# Patient Record
Sex: Female | Born: 1960 | Hispanic: No | Marital: Married | Smoking: Never smoker
Health system: Southern US, Community
[De-identification: ages and names within clinical notes are randomized; demographics above are authoritative.]

## PROBLEM LIST (undated history)

## (undated) DIAGNOSIS — K219 Gastro-esophageal reflux disease without esophagitis: Secondary | ICD-10-CM

## (undated) DIAGNOSIS — M1990 Unspecified osteoarthritis, unspecified site: Secondary | ICD-10-CM

## (undated) DIAGNOSIS — E7800 Pure hypercholesterolemia, unspecified: Secondary | ICD-10-CM

## (undated) DIAGNOSIS — G709 Myoneural disorder, unspecified: Secondary | ICD-10-CM

## (undated) HISTORY — DX: Gastro-esophageal reflux disease without esophagitis: K21.9

## (undated) HISTORY — PX: CHOLECYSTECTOMY: SHX55

## (undated) HISTORY — DX: Myoneural disorder, unspecified: G70.9

## (undated) HISTORY — DX: Unspecified osteoarthritis, unspecified site: M19.90

---

## 2020-08-29 ENCOUNTER — Ambulatory Visit (INDEPENDENT_AMBULATORY_CARE_PROVIDER_SITE_OTHER): Payer: Medicaid Other | Admitting: Family Medicine

## 2020-08-29 ENCOUNTER — Other Ambulatory Visit: Payer: Self-pay

## 2020-08-29 ENCOUNTER — Other Ambulatory Visit (HOSPITAL_COMMUNITY)
Admission: RE | Admit: 2020-08-29 | Discharge: 2020-08-29 | Disposition: A | Discharge status: Home / Self Care | Payer: Medicaid Other | Source: Ambulatory Visit | Attending: Family Medicine | Admitting: Family Medicine

## 2020-08-29 VITALS — BP 132/86 | HR 82 | Ht 62.01 in | Wt 172.0 lb

## 2020-08-29 DIAGNOSIS — N6320 Unspecified lump in the left breast, unspecified quadrant: Secondary | ICD-10-CM

## 2020-08-29 DIAGNOSIS — Z0289 Encounter for other administrative examinations: Secondary | ICD-10-CM

## 2020-08-29 DIAGNOSIS — Z0000 Encounter for general adult medical examination without abnormal findings: Secondary | ICD-10-CM | POA: Insufficient documentation

## 2020-08-29 LAB — POCT UA - MICROSCOPIC ONLY

## 2020-08-29 LAB — POCT URINALYSIS DIP (MANUAL ENTRY)
Bilirubin, UA: NEGATIVE
Glucose, UA: NEGATIVE mg/dL
Ketones, POC UA: NEGATIVE mg/dL
Nitrite, UA: NEGATIVE
Protein Ur, POC: NEGATIVE mg/dL
Spec Grav, UA: 1.025 (ref 1.010–1.025)
Urobilinogen, UA: 0.2 E.U./dL
pH, UA: 5 (ref 5.0–8.0)

## 2020-08-29 LAB — POCT URINE PREGNANCY: Preg Test, Ur: NEGATIVE

## 2020-08-29 MED ORDER — IVERMECTIN 3 MG PO TABS: 200.0000 ug/kg | ORAL_TABLET | Freq: Every day | ORAL | 0 refills | Status: AC

## 2020-08-29 MED ORDER — ALBENDAZOLE 200 MG PO TABS: 400.0000 mg | ORAL_TABLET | Freq: Once | ORAL | 0 refills | Status: AC

## 2020-08-29 NOTE — Progress Notes (Signed)
Patient Name: Nichole Baker Date of Birth: Dec 26, 1960 Date of Visit: 08/29/20 PCP: No primary care provider on file.  Chief Complaint: refugee intake examination and rash/lump left breast and back.  The patient's preferred language is Dari. A phone interpreter was used for the entire visit.  Interpreter Name or ID: Jamison Oka #[id number], Female phone interpreter for 2nd half of visit.   Subjective: Nichole Baker is a pleasant 60 y.o. presenting today for an initial refugee and immigrant clinic visit.   According to the patient, she first noticed a spot on her left breast 4 months ago. She then also noticed a lump in her breast and began experiencing swelling and and a sense of heaviness on her left side extending into her left upper back. She states that the spot on the skin of her chest has not changed in size. The lump and the swelling she feels in her breast and back has not changed in size since she first noticed it, but it has become progressively more painful. She describes the pain as aching, burning, and itching. No nipple discharge. There is also occasionally the sensation of something crawling under the skin where the breast lump is, but she denies noticing any red lines over her chest. She reports chest pain that she feels is related to the lump that is primarily on the left side. No shortness of breath with exertion. She is unsure if the back pain she is feeling is related to the rash/lump in breast because she is having both upper and lower back pain. Patient is unsure if she had chicken pox as a child. The patient also complains of left sided shoulder pain that shoots down to her hand. Patient denies any lump or rash or pain on right side of chest.  She also reports some mild knee pain.  Patient also has a history of hemorrhoids since she got married. It is not currently bothering her. No blood in stool or pain with bowel movements. She previously used a cream for relief.   ROS: Negative  unless noted in HPI above.  GYN history deferred.  PMH: Hemorrhoids   PSH: Cholecystectomy (8 yrs ago)  FH: Sister in [place] with Diabetes  Allergies: NKDA   Current Medications: None  Social History: Home: Lives with husband and 4 children currently.  Diet: Able to find foods that are acceptable Safety: Feels safe at home.  Tobacco Use: No Alcohol Use: No In the past two weeks, have you run out of food before you had money to purchase more? No  In the past two weeks, have you had difficulty with obtaining food for your family? 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 Normocephalic. Sclera anicteric. Appears well hydrated. Neck: Supple, Normal range of motion. Cardiac: Mildly tachycardic, regular rhythm. Normal S1/S2. No murmurs, rubs, or gallops appreciated. Lungs: Clear bilaterally to ascultation. Normal work of breathing. Abdomen: Normoactive bowel sounds. No tenderness to deep or light palpation. No  rebound or guarding. No CVA tenderness. Extremities: Warm, well perfused without edema.  Skin: small 1cm red ring surrounding black pinpoint spot on RUQ of left breast. No rash noted over left side and back. Scattered small cherry hemangiomas noted over back and abdominal skin. Breast: 7cm x 6cm firm, mobile mass noted in LLQ of left breast. Enlarged blood vessel noted over areola. No significant retraction  or dimpling noted over areola or skin of breast. No discharge noted.   Axilla: No lymphadenopathy in left axilla. Back: No rash or swelling noted over left back. Psych: Pleasant and appropriate  MSK: No gross deformity noted. Neuro: Appropriate gait.    Assessment and Plan: 1. Refugee Initial Exam -Presumptive Parasite Treatment with Albendazole and Ivermectin Prescribed -Urine sample collected (UA, UPT) -Blood work completed (HCV, TSH, Lipid Panel, HepB, VZV titer, CMP, CBC, HIV, RPR) -Follow up with patient on March 25th, 2022.  2. Left Breast Mass Large (7cmx6cm) left sided breast mass noted on physical exam. Patient denies it changing significantly in size, but reports it has become progressively more painful. Given that the mass is mobile, it is possible that it could be a fibroadenoma, though it does not change in size. The mass may also represent a large breast cyst or lipoma.  -Patient scheduled for Mammogram and Breast ultrasound on 10/11/2020 -Will consider further management after results of ultrasound. -Patient instructed to follow up sooner if she develops new breast discharge or significant changes to the mass. - Explained blackhead noted on breast- no treatment needed as it is resolving on its own  3. Hemorrhoids -Given that hemorrhoids are not currently bothering the patient, will follow up if they become symptomatic or if there is new onset of blood in stool.    I have personally updated the history tabs within Epic and included the refugee information in social documentation.   Designated Partner Release signed with agency  Release of information signed for Health Department   Return to care in 1 month in [facility] for visit with PCP (09/29/2020 with Dr. Thompson Grayer).   Vaccines: updated in [employer] of Supervision of Student:  I confirm that I have verified the information documented in the medical student's note and that I have also personally performed the history,  physical exam and all medical decision making activities.  I have verified that all services and findings are accurately documented in this student's note; and I agree with management and plan as outlined in the documentation. I have also made any necessary editorial changes.   Lenoria Chime, MD

## 2020-08-29 NOTE — Patient Instructions (Addendum)
It was wonderful to see you today.  Please bring ALL of your medications with you to every visit.   Today we talked about:  - F/u appt scheduled with Dr Thompson Grayer to f/u breast mass - Presumptive parasite treatment: albendazole 400 mg once and ivermectin 5 tabs daily x 2 days - We will call you with your lab work results - Mammogram and ultrasound scheduled    Thank you for choosing [facility].   Please call [phone] with any questions about today's appointment.  Please be sure to schedule follow up at the front  desk before you leave today.   Yehuda Savannah, MD  Family Medicine

## 2020-08-30 DIAGNOSIS — N6320 Unspecified lump in the left breast, unspecified quadrant: Secondary | ICD-10-CM | POA: Insufficient documentation

## 2020-08-30 LAB — COMPREHENSIVE METABOLIC PANEL
ALT: 12 IU/L (ref 0–32)
AST: 10 IU/L (ref 0–40)
Albumin/Globulin Ratio: 1.6 (ref 1.2–2.2)
Albumin: 4.4 g/dL (ref 3.8–4.9)
Alkaline Phosphatase: 94 IU/L (ref 44–121)
BUN/Creatinine Ratio: 19 (ref 9–23)
BUN: 13 mg/dL (ref 6–24)
Bilirubin Total: 0.3 mg/dL (ref 0.0–1.2)
CO2: 17 mmol/L — ABNORMAL LOW (ref 20–29)
Calcium: 9.4 mg/dL (ref 8.7–10.2)
Chloride: 107 mmol/L — ABNORMAL HIGH (ref 96–106)
Creatinine, Ser: 0.69 mg/dL (ref 0.57–1.00)
GFR calc Af Amer: 111 mL/min/{1.73_m2} (ref >59)
GFR calc non Af Amer: 96 mL/min/{1.73_m2} (ref >59)
Globulin, Total: 2.8 g/dL (ref 1.5–4.5)
Glucose: 116 mg/dL — ABNORMAL HIGH (ref 65–99)
Potassium: 4.1 mmol/L (ref 3.5–5.2)
Sodium: 143 mmol/L (ref 134–144)
Total Protein: 7.2 g/dL (ref 6.0–8.5)

## 2020-08-30 LAB — HEPATITIS B SURFACE ANTIGEN: Hepatitis B Surface Ag: NEGATIVE

## 2020-08-30 LAB — CBC WITH DIFFERENTIAL/PLATELET
Basophils Absolute: 0.1 10*3/uL (ref 0.0–0.2)
Basos: 1 %
EOS (ABSOLUTE): 0.2 10*3/uL (ref 0.0–0.4)
Eos: 2 %
Hematocrit: 41.5 % (ref 34.0–46.6)
Hemoglobin: 13.9 g/dL (ref 11.1–15.9)
Immature Grans (Abs): 0 10*3/uL (ref 0.0–0.1)
Immature Granulocytes: 0 %
Lymphocytes Absolute: 2.1 10*3/uL (ref 0.7–3.1)
Lymphs: 27 %
MCH: 29 pg (ref 26.6–33.0)
MCHC: 33.5 g/dL (ref 31.5–35.7)
MCV: 87 fL (ref 79–97)
Monocytes Absolute: 0.4 10*3/uL (ref 0.1–0.9)
Monocytes: 6 %
Neutrophils Absolute: 4.9 10*3/uL (ref 1.4–7.0)
Neutrophils: 64 %
Platelets: 289 10*3/uL (ref 150–450)
RBC: 4.79 x10E6/uL (ref 3.77–5.28)
RDW: 13.1 % (ref 11.7–15.4)
WBC: 7.6 10*3/uL (ref 3.4–10.8)

## 2020-08-30 LAB — HEPATITIS B SURFACE ANTIBODY, QUANTITATIVE: Hepatitis B Surf Ab Quant: 199.2 m[IU]/mL (ref >9.9)

## 2020-08-30 LAB — LIPID PANEL
Chol/HDL Ratio: 6.7 ratio — ABNORMAL HIGH (ref 0.0–4.4)
Cholesterol, Total: 280 mg/dL — ABNORMAL HIGH (ref 100–199)
HDL: 42 mg/dL (ref >39)
LDL Chol Calc (NIH): 187 mg/dL — ABNORMAL HIGH (ref 0–99)
Triglycerides: 262 mg/dL — ABNORMAL HIGH (ref 0–149)
VLDL Cholesterol Cal: 51 mg/dL — ABNORMAL HIGH (ref 5–40)

## 2020-08-30 LAB — RPR: RPR Ser Ql: NONREACTIVE

## 2020-08-30 LAB — HEPATITIS B CORE ANTIBODY, TOTAL: Hep B Core Total Ab: NEGATIVE

## 2020-08-30 LAB — HCV INTERPRETATION

## 2020-08-30 LAB — VARICELLA ZOSTER ANTIBODY, IGG: Varicella zoster IgG: 2922 index (ref >165)

## 2020-08-30 LAB — HCV AB W REFLEX TO QUANT PCR: HCV Ab: 0.1 s/co ratio (ref 0.0–0.9)

## 2020-08-30 LAB — TSH: TSH: 1.28 u[IU]/mL (ref 0.450–4.500)

## 2020-08-30 LAB — HIV ANTIBODY (ROUTINE TESTING W REFLEX): HIV Screen 4th Generation wRfx: NONREACTIVE

## 2020-08-31 LAB — URINE CYTOLOGY ANCILLARY ONLY
Chlamydia: NEGATIVE
Comment: NEGATIVE
Comment: NORMAL
Neisseria Gonorrhea: NEGATIVE

## 2020-09-08 ENCOUNTER — Telehealth: Payer: Self-pay

## 2020-09-08 NOTE — Telephone Encounter (Signed)
Received fax from pharmacy, PA needed on Ivermectin 3mg . Clinical questions submitted via Cover My Meds. Waiting on response, could take up to 72 hours.  Cover My Meds info: Key: [id number]

## 2020-09-12 NOTE — Telephone Encounter (Signed)
I attempted to inform the pharmacy, however unsure what pharmacy the patient used. The original prescription was a paper print out. I called the patient and she stated she would have to reach out to her "helpers" to see which pharmacy and will call back.

## 2020-09-15 ENCOUNTER — Ambulatory Visit: Payer: Self-pay | Admitting: Family Medicine

## 2020-09-22 NOTE — Progress Notes (Signed)
    SUBJECTIVE:   CHIEF COMPLAINT / HPI:   Left breast mass- diagnostic mammogram scheduled October 11, 2020. Have called twice and no sooner appointments available. Patient notes occassional breast pain is about the same, does not feel like the mass is growing, describes a heaviness in her breast as well. No skin changes or breast discharge or bleeding that she has noticed.  Elevated LDL- she walks 1-1.5 hours multiple days per week, has been using less oil eating more vegetables. No sweets for the past month as they felt the kids were "getting addicted."  Took albendazole but was unable to pick up ivermectin. Needs new script, prior auth approved. Sponsor notes they tried to pick it up multiple times. Will resend.  PERTINENT  PMH / PSH:as above  OBJECTIVE:   BP 112/70   Pulse 83   Ht 5\' 2"  (1.575 m)   Wt 170 lb 12.8 oz (77.5 kg)   SpO2 97%   BMI 31.24 kg/m   General: A&O, NAD HEENT: No sign of trauma, EOM grossly intact Cardiac: RRR, no m/r/g Breast: mobile firm mass 6cm x 7cm in LLQ of left breast without overlying skin changes, dimpling, or nipple discharge. Enlarged blood vessel over left areola noted. Respiratory: CTAB, normal WOB, no w/c/r GI: Soft, NTTP, non-distended  Extremities: NTTP, no peripheral edema. Neuro: Normal gait, moves all four extremities appropriately. Psych: Appropriate mood and affect  Eusebio Friendly CMA present as chaperone for breast exam.  ASSESSMENT/PLAN:   Encounter for health examination of refugee - ivermectin resent to pharmacy, 5 tabs daily x 2 days - discussed lab results from initial visit today  Elevated LDL cholesterol level - LDL 187 on recent lipid panel, discussed with patient trial of lifestyle changes versus starting statin, she would like to try lifestyle changes, discussed diet and exercise in detail today  Left breast mass - given date/time of mammogram appointment, repeat exam unchanged today, answered all  questions/concerns - schedule f/u appt 4/8 in clinic to discuss mammogram results/next steps in case we are unable to reach by phone   Discussed need for pap smear, have scheduled f/u visit with me after mammogram results return to discuss further  Nichole Chime, MD [place]

## 2020-09-29 ENCOUNTER — Encounter: Payer: Self-pay | Admitting: Family Medicine

## 2020-09-29 ENCOUNTER — Other Ambulatory Visit: Payer: Self-pay

## 2020-09-29 ENCOUNTER — Ambulatory Visit (INDEPENDENT_AMBULATORY_CARE_PROVIDER_SITE_OTHER): Payer: Medicaid Other | Admitting: Family Medicine

## 2020-09-29 VITALS — BP 112/70 | HR 83 | Ht 62.0 in | Wt 170.8 lb

## 2020-09-29 DIAGNOSIS — N6320 Unspecified lump in the left breast, unspecified quadrant: Secondary | ICD-10-CM

## 2020-09-29 DIAGNOSIS — E7800 Pure hypercholesterolemia, unspecified: Secondary | ICD-10-CM | POA: Diagnosis not present

## 2020-09-29 DIAGNOSIS — Z0289 Encounter for other administrative examinations: Secondary | ICD-10-CM | POA: Diagnosis not present

## 2020-09-29 MED ORDER — IVERMECTIN 3 MG PO TABS: 200.0000 ug/kg | ORAL_TABLET | Freq: Every day | ORAL | 0 refills | Status: DC

## 2020-09-29 NOTE — Assessment & Plan Note (Addendum)
-   given date/time of mammogram appointment, have called [facility] twice and unable to do sooner, called [facility] and unable to do mammogram based on insurance, thus her current appt is the soonest she is able to be seen - repeat exam unchanged today, answered all questions/concerns - schedule f/u appt 4/8 in clinic to discuss mammogram results/next steps in case we are unable to reach by phone

## 2020-09-29 NOTE — Assessment & Plan Note (Signed)
-   ivermectin resent to pharmacy, 5 tabs daily x 2 days - discussed lab results from initial visit today

## 2020-09-29 NOTE — Patient Instructions (Addendum)
It was wonderful to see you today.  Please bring ALL of your medications with you to every visit.   Today we talked about:  - Mammogram scheduled for 4/6, and then follow up with [place] for 4/8 - Will need pap smear in future, have scheduled follow-up visit for that   Thank you for choosing [facility].   Please call [phone] with any questions about today's appointment.  Please be sure to schedule follow up at the front  desk before you leave today.   Yehuda Savannah, MD  Family Medicine

## 2020-09-29 NOTE — Assessment & Plan Note (Signed)
-   LDL 187 on recent lipid panel, discussed with patient trial of lifestyle changes versus starting statin, she would like to try lifestyle changes, discussed diet and exercise in detail today

## 2020-10-11 ENCOUNTER — Other Ambulatory Visit: Payer: Self-pay

## 2020-10-11 ENCOUNTER — Ambulatory Visit
Admission: RE | Admit: 2020-10-11 | Discharge: 2020-10-11 | Disposition: A | Discharge status: Home / Self Care | Payer: Medicaid Other | Source: Ambulatory Visit | Attending: Family Medicine | Admitting: Family Medicine

## 2020-10-11 ENCOUNTER — Other Ambulatory Visit: Payer: Self-pay | Admitting: Family Medicine

## 2020-10-11 DIAGNOSIS — N6320 Unspecified lump in the left breast, unspecified quadrant: Secondary | ICD-10-CM

## 2020-10-13 ENCOUNTER — Encounter: Payer: Self-pay | Admitting: Family Medicine

## 2020-10-13 ENCOUNTER — Other Ambulatory Visit: Payer: Self-pay

## 2020-10-13 ENCOUNTER — Ambulatory Visit (INDEPENDENT_AMBULATORY_CARE_PROVIDER_SITE_OTHER): Payer: Medicaid Other | Admitting: Family Medicine

## 2020-10-13 VITALS — BP 125/75 | HR 86 | Ht 60.32 in | Wt 170.0 lb

## 2020-10-13 DIAGNOSIS — Z1211 Encounter for screening for malignant neoplasm of colon: Secondary | ICD-10-CM

## 2020-10-13 DIAGNOSIS — R140 Abdominal distension (gaseous): Secondary | ICD-10-CM | POA: Insufficient documentation

## 2020-10-13 DIAGNOSIS — H7193 Unspecified cholesteatoma, bilateral: Secondary | ICD-10-CM

## 2020-10-13 DIAGNOSIS — R4589 Other symptoms and signs involving emotional state: Secondary | ICD-10-CM | POA: Insufficient documentation

## 2020-10-13 DIAGNOSIS — Z0000 Encounter for general adult medical examination without abnormal findings: Secondary | ICD-10-CM

## 2020-10-13 DIAGNOSIS — L700 Acne vulgaris: Secondary | ICD-10-CM | POA: Insufficient documentation

## 2020-10-13 DIAGNOSIS — F418 Other specified anxiety disorders: Secondary | ICD-10-CM

## 2020-10-13 DIAGNOSIS — H7190 Unspecified cholesteatoma, unspecified ear: Secondary | ICD-10-CM | POA: Insufficient documentation

## 2020-10-13 MED ORDER — CALCIUM CARBONATE ANTACID 500 MG PO CHEW: 1.0000 | CHEWABLE_TABLET | Freq: Every day | ORAL | 1 refills | Status: DC | PRN

## 2020-10-13 NOTE — Assessment & Plan Note (Signed)
Patient required a lot of reassurance in regards to her single breast bump which I feel is most likely a pimple.  She continuously asked if there is medication for this though I continue to reassure her.  I do believe that she has a component of anxiety regarding her health, which is understandable.  I tried to reassure her as much as possible.  I do believe that her palpitations are likely related to anxiety as they only seem to occur when going to doctors offices.  -Continue to offer reassurance -Consider investigating the cause of her underlying worries regarding her health

## 2020-10-13 NOTE — Assessment & Plan Note (Signed)
Patient is due for a Pap smear and screening colonoscopy.  I did make an appointment for her to be seen by me on 4/19, though I just noticed that she has an earlier appointment with her PCP on 4/14.  She may be able to get Pap smear at that time.  She can keep appointment with me afterwards to discuss results if PCP would like, otherwise could cancel her follow-up appointment with me. -GI referral sent for screening colonoscopy

## 2020-10-13 NOTE — Assessment & Plan Note (Signed)
Appreciated on bilateral ears.  Patient did have elevated lipid panel though ASCVD risk only 4.3% and statins not recommended at this time.  She does walk 1 hour a day for exercise.  Also notes that she has been improving her diet and cutting back on fried foods.  Hearing screening was not completed today.  Could consider a follow-up.  Referral to ENT if necessary, pending screening. -Consider hearing screen at follow-up -Consider ENT referral if necessary

## 2020-10-13 NOTE — Assessment & Plan Note (Signed)
There was a blackhead noted to the superior anterior medial left breast.  This was easily removed.  Recommended that patient apply warm compresses to the surrounding small bump.  This may or might not help to reduce it.  Counseled patient on return precautions including worsening pain and/or streaking of erythema.

## 2020-10-13 NOTE — Patient Instructions (Addendum)
It was wonderful to see you today.  Please bring ALL of your medications with you to every visit.   Today we talked about your mammogram findings. Everything looked good, you will need a repeat mammogram in 1 year.  You can take Tums as needed for your bloating.  You have an appointment on 4/19 at 8:45 AM with me for a pap smear.  I will send a referral to GI for a routine colonoscopy for colon cancer screening. They will call you.   Thank you for choosing [facility].   Please call [phone] with any questions about today's appointment.  Please be sure to schedule follow up at the front  desk before you leave today.   Sharion Settler, DO PGY-1 Family Medicine

## 2020-10-13 NOTE — Assessment & Plan Note (Signed)
Confirmed today that patient has completed her course of albendazole and ivermectin.  She does complain of some occasional bloating after eating.  Exam notable for some mild epigastric tenderness to palpation but reassuringly, abdomen was soft and nondistended without rebound or guarding.  Denies any reflux symptoms. -Rx Tums to be used as needed

## 2020-10-13 NOTE — Progress Notes (Signed)
SUBJECTIVE:   CHIEF COMPLAINT / HPI:   Discuss mammogram/skin concern Patient presents today to discuss her recent mammogram findings.  Also present with interpreter he states that the findings were also mentioned to her filling the mammogram.  Patient is aware that she had a normal mammogram but is concerned about this small red bump on her skin.  She was told that this was a blackhead and that she should see her primary care doctor for this.  It is not painful, normal growing.  She is wondering if there is any medication that can be used for this.  Palpitations Patient also notes a funny feeling in her heart on occasion.  This only happens sporadically and only seems to happen when she comes to the doctors.  It only lasts a couple seconds.  There is no associated chest pain or shortness of breath.   Bloating Patient notes that on occasion she feels bloated.  This usually happens after eating.  She does not eat or drink excessive amounts of dairy.  Ear concerns Patient would like her ears checked.  She is concerned about her hearing.  Also wondering if anything is wrong with her ears.   PERTINENT  PMH / PSH: No past medical history on file.   OBJECTIVE:   BP 125/75   Pulse 86   Ht 5' 0.32" (1.532 m)   Wt 170 lb (77.1 kg)   SpO2 98%   BMI 32.85 kg/m   Gen: Awake, alert, no distress, pleasant, cooperative Ears: Bilateral cholesteatoma appreciated, no erythema, TMs opaque Resp: CTA B, no wheezing/rhonchi/rales Cardio: RRR, no murmurs Abdomen: Normoactive bowel sounds, soft, nondistended, mild tenderness to palpation epigastric region without rebound or guarding, no organomegaly Skin: Blackhead to left anterior superior medial breast with an adjacent raised and slightly erythematous nodule.  No surrounding erythema.  No tenderness to palpation.   ASSESSMENT/PLAN:   Healthcare maintenance Patient is due for a Pap smear and screening colonoscopy.  I did make an appointment  for her to be seen by me on 4/19, though I just noticed that she has an earlier appointment with her PCP on 4/14.  She may be able to get Pap smear at that time.  She can keep appointment with me afterwards to discuss results if PCP would like, otherwise could cancel her follow-up appointment with me. -GI referral sent for screening colonoscopy  Anxiety about health Patient required a lot of reassurance in regards to her single breast bump which I feel is most likely a pimple.  She continuously asked if there is medication for this though I continue to reassure her.  I do believe that she has a component of anxiety regarding her health, which is understandable.  I tried to reassure her as much as possible.  I do believe that her palpitations are likely related to anxiety as they only seem to occur when going to doctors offices.  -Continue to offer reassurance -Consider investigating the cause of her underlying worries regarding her health  Bloating Confirmed today that patient has completed her course of albendazole and ivermectin.  She does complain of some occasional bloating after eating.  Exam notable for some mild epigastric tenderness to palpation but reassuringly, abdomen was soft and nondistended without rebound or guarding.  Denies any reflux symptoms. -Rx Tums to be used as needed  Cholesteatoma Appreciated on bilateral ears.  Patient did have elevated lipid panel though ASCVD risk only 4.3% and statins not recommended at this time.  She does walk 1 hour a day for exercise.  Also notes that she has been improving her diet and cutting back on fried foods.  Hearing screening was not completed today.  Could consider a follow-up.  Referral to ENT if necessary, pending screening. -Consider hearing screen at follow-up -Consider ENT referral if necessary  Black head There was a blackhead noted to the superior anterior medial left breast.  This was easily removed.  Recommended that patient apply  warm compresses to the surrounding small bump.  This may or might not help to reduce it.  Counseled patient on return precautions including worsening pain and/or streaking of erythema.     Sharion Settler, [place]

## 2020-10-18 NOTE — Progress Notes (Signed)
    SUBJECTIVE:   CHIEF COMPLAINT / HPI:  In person Dari interpretor Mahin present for entire visit interpretation.  Patient presents for pap smear. Discussed the procedure and reasons for completion and she is okay with proceeding. She has not had her menses for 10 years. Denies any pelvic complaints.  PERTINENT  PMH / PSH: benign breast hamartoma  OBJECTIVE:   BP 120/68   Pulse 91   Ht 5' (1.524 m)   Wt 169 lb 3.2 oz (76.7 kg)   SpO2 99%   BMI 33.04 kg/m   General: A&O, NAD HEENT: No sign of trauma, EOM grossly intact Respiratory: normal WOB GI: non-distended  Extremities: no peripheral edema. Neuro: Normal gait, moves all four extremities appropriately Skin: no lesions/rashes visualized Psych: Appropriate mood and affect  GU: Normal appearance of labia majora and minora, without lesions. Vagina tissue pink, moist, without lesions or abrasions. Cervix normal appearance, non-friable, without discharge from os.   Eusebio Friendly CMA present as chaperone for pelvic exam.  ASSESSMENT/PLAN:   Encounter for Papanicolaou smear for cervical cancer screening - pap smear with HPV co-testing performed today, will try and call patient with results but also has a follow-up with Dr Nita Sells to discuss   Can also do hearing screen at follow-up appointment  Lenoria Chime, [place]

## 2020-10-19 ENCOUNTER — Other Ambulatory Visit: Payer: Self-pay

## 2020-10-19 ENCOUNTER — Ambulatory Visit (INDEPENDENT_AMBULATORY_CARE_PROVIDER_SITE_OTHER): Payer: Medicaid Other | Admitting: Family Medicine

## 2020-10-19 ENCOUNTER — Other Ambulatory Visit (HOSPITAL_COMMUNITY)
Admission: RE | Admit: 2020-10-19 | Discharge: 2020-10-19 | Disposition: A | Discharge status: Home / Self Care | Payer: Medicaid Other | Source: Ambulatory Visit | Attending: Family Medicine | Admitting: Family Medicine

## 2020-10-19 ENCOUNTER — Other Ambulatory Visit: Payer: Self-pay | Admitting: Family Medicine

## 2020-10-19 VITALS — BP 120/68 | HR 91 | Ht 60.0 in | Wt 169.2 lb

## 2020-10-19 DIAGNOSIS — Z124 Encounter for screening for malignant neoplasm of cervix: Secondary | ICD-10-CM | POA: Diagnosis present

## 2020-10-19 DIAGNOSIS — N6320 Unspecified lump in the left breast, unspecified quadrant: Secondary | ICD-10-CM

## 2020-10-19 NOTE — Patient Instructions (Addendum)
It was wonderful to see you today.  Please bring ALL of your medications with you to every visit.   Today we talked about:  - Pap smear done today, will discuss results at your next appointment with Dr Nita Sells as well as try to call you - We can do your hearing screen at next appt   Thank you for choosing [facility].   Please call [phone] with any questions about today's appointment.  Please be sure to schedule follow up at the front  desk before you leave today.   Yehuda Savannah, MD  Family Medicine

## 2020-10-19 NOTE — Assessment & Plan Note (Signed)
-   pap smear with HPV co-testing performed today, will try and call patient with results but also has a follow-up with Dr Nita Sells to discuss

## 2020-10-24 ENCOUNTER — Ambulatory Visit: Payer: Medicaid Other | Admitting: Family Medicine

## 2020-10-24 LAB — CYTOLOGY - PAP
Comment: NEGATIVE
Diagnosis: NEGATIVE
High risk HPV: NEGATIVE

## 2020-11-09 ENCOUNTER — Ambulatory Visit (INDEPENDENT_AMBULATORY_CARE_PROVIDER_SITE_OTHER): Payer: Medicaid Other | Admitting: Family Medicine

## 2020-11-09 ENCOUNTER — Encounter: Payer: Self-pay | Admitting: Family Medicine

## 2020-11-09 ENCOUNTER — Other Ambulatory Visit: Payer: Self-pay

## 2020-11-09 VITALS — HR 92 | Ht 60.0 in | Wt 168.2 lb

## 2020-11-09 DIAGNOSIS — Z0000 Encounter for general adult medical examination without abnormal findings: Secondary | ICD-10-CM

## 2020-11-09 DIAGNOSIS — K649 Unspecified hemorrhoids: Secondary | ICD-10-CM | POA: Insufficient documentation

## 2020-11-09 DIAGNOSIS — L918 Other hypertrophic disorders of the skin: Secondary | ICD-10-CM | POA: Diagnosis not present

## 2020-11-09 DIAGNOSIS — H7193 Unspecified cholesteatoma, bilateral: Secondary | ICD-10-CM

## 2020-11-09 MED ORDER — PHENYLEPHRINE-MINERAL OIL-PET 0.25-14-74.9 % RE OINT
1.0000 | TOPICAL_OINTMENT | Freq: Two times a day (BID) | RECTAL | 0 refills | Status: DC | PRN
Start: 2020-11-09 — End: 2022-12-30

## 2020-11-09 NOTE — Progress Notes (Signed)
    SUBJECTIVE:   In-person Dari interpreter, Mahin used for entirety of visit.   CHIEF COMPLAINT / HPI:   Hearing Screen  Skin Concern Patient presents for hearing screen after discovery of cholesteatoma and prior visit. She also notes she has bumps on the outside of her right ear. They do not hurt but she feels they have been growing and they occasionally itch. She states that she was recommended to have these lasered off in her home country but this never occurred.   Hemorrhoids States that she has hemorrhoids. Is wondering if she can have surgery for them. No rectal bleeding, change in stool habits. No pain. She is not using anything for them currently.   Knee Pain Patient briefly mentioned continued b/l knee pain at end of visit. She is scheduled for follow up to discuss this further as time did not allow for this evaluation today.   PERTINENT  PMH / PSH: No past medical history on file.   OBJECTIVE:   Pulse 92   Ht 5' (1.524 m)   Wt 168 lb 4 oz (76.3 kg)   SpO2 99%   BMI 32.86 kg/m   General: NAD, pleasant, able to participate in exam Skin: multiple, pedunculated and hyperpigmented verrucous appearing lesions on posterior right ear. Non-painful to touch Psych: Normal affect and mood        ASSESSMENT/PLAN:   Cholesteatoma Patient with cholesteatoma of bilateral ears. Presented today for hearing screen, which she passed. We briefly discussed nutritional modification and she was encouraged to continue her daily walks for exercise.  -Referral to ENT for further management.   Skin tag of ear Patient with multiple skin-tag/verrucous appearing lesions on posterior right ear. Though they do not hurt, they are cosmetically bothersome to her. Given location of these, will refer to dermatology outpatient. May be able to schedule in our derm clinic as well to start treatment/removal. Will discuss further with patient at her follow up appointment with me next month.   -Dermatology referral sent -D/w patient at f/u about starting treatment/removal in our derm clinic in the interim   Hemorrhoids Patient with hemorrhoids, appears to be internal as they are non-painful. Examination deferred today as patient does not have any symptoms with this: no bleeding, stool changes, signs/symptoms of anemia, pain. She was previously referred to GI for screening colonoscopy though reports she has not been contacted yet. We discussed she could have potential treatment of her hemorrhoids during this procedure.  -Encouraged increased fiber intake -Sitz baths as needed -Preparation H prescribed  -F/u with GI for screening colonoscopy   Healthcare maintenance Also discussed with patient today about her normal Pap smear results. Next co-testing screen in 5 years.        Sharion Settler, [place]

## 2020-11-09 NOTE — Assessment & Plan Note (Addendum)
Patient with multiple skin-tag/verrucous appearing lesions on posterior right ear. Though they do not hurt, they are cosmetically bothersome to her. Given location of these, will refer to dermatology outpatient. May be able to schedule in our derm clinic as well to start treatment/removal. Will discuss further with patient at her follow up appointment with me next month.  -Dermatology referral sent -D/w patient at f/u about starting treatment/removal in our derm clinic in the interim

## 2020-11-09 NOTE — Assessment & Plan Note (Signed)
Patient with cholesteatoma of bilateral ears. Presented today for hearing screen, which she passed. We briefly discussed nutritional modification and she was encouraged to continue her daily walks for exercise.  -Referral to ENT for further management.

## 2020-11-09 NOTE — Assessment & Plan Note (Signed)
Also discussed with patient today about her normal Pap smear results. Next co-testing screen in 5 years.

## 2020-11-09 NOTE — Patient Instructions (Addendum)
It was wonderful to see you today.  Please bring ALL of your medications with you to every visit.   Today we talked about:  -Your Pap smear returned normal. -You passed your hearing test today. -Your referral for a screening colonoscopy is in, they should call you to schedule an appointment.  -We placed a dermatology appointment today for the back of your ear. -We placed a referral for another doctor for your inner ear (this is an ear, nose and throat doctor).  -You can use preparation H cream to the area of your hemorrhoids. I would also suggest increasing your fiber. There is more information below.  -You have an appointment with me on June 1st at 4:15PM.  Thank you for choosing [facility].   Please call [phone] with any questions about today's appointment.  Sharion Settler, DO PGY-1 Family Medicine   [facility] [place], [place], [place] [address]   Hemorrhoids Hemorrhoids are swollen veins in and around the rectum or anus. There are two types of hemorrhoids:  Internal hemorrhoids. These occur in the veins that are just inside the rectum. They may poke through to the outside and become irritated and painful.  External hemorrhoids. These occur in the veins that are outside the anus and can be felt as a painful swelling or hard lump near the anus. Most hemorrhoids do not cause serious problems, and they can be managed with home treatments such as diet and lifestyle changes. If home treatments do not help the symptoms, procedures can be done to shrink or remove the hemorrhoids. What are the causes? This condition is caused by increased pressure in the anal area. This pressure may result from various things, including:  Constipation.  Straining to have a bowel movement.  Diarrhea.  Pregnancy.  Obesity.  Sitting for long periods of time.  Heavy lifting or other activity that causes you to strain.  Anal sex.  Riding a bike for a  long period of time. What are the signs or symptoms? Symptoms of this condition include:  Pain.  Anal itching or irritation.  Rectal bleeding.  Leakage of stool (feces).  Anal swelling.  One or more lumps around the anus. How is this diagnosed? This condition can often be diagnosed through a visual exam. Other exams or tests may also be done, such as:  An exam that involves feeling the rectal area with a gloved hand (digital rectal exam).  An exam of the anal canal that is done using a small tube (anoscope).  A blood test, if you have lost a significant amount of blood.  A test to look inside the colon using a flexible tube with a camera on the end (sigmoidoscopy or colonoscopy). How is this treated? This condition can usually be treated at home. However, various procedures may be done if dietary changes, lifestyle changes, and other home treatments do not help your symptoms. These procedures can help make the hemorrhoids smaller or remove them completely. Some of these procedures involve surgery, and others do not. Common procedures include:  Rubber band ligation. Rubber bands are placed at the base of the hemorrhoids to cut off their blood supply.  Sclerotherapy. Medicine is injected into the hemorrhoids to shrink them.  Infrared coagulation. A type of light energy is used to get rid of the hemorrhoids.  Hemorrhoidectomy surgery. The hemorrhoids are surgically removed, and the veins that supply them are tied off.  Stapled hemorrhoidopexy surgery. The surgeon staples the base  of the hemorrhoid to the rectal wall. Follow these instructions at home: Eating and drinking  Eat foods that have a lot of fiber in them, such as whole grains, beans, nuts, fruits, and vegetables.  Ask your health care provider about taking products that have added fiber (fiber supplements).  Reduce the amount of fat in your diet. You can do this by eating low-fat dairy products, eating less red  meat, and avoiding processed foods.  Drink enough fluid to keep your urine pale yellow.   Managing pain and swelling  Take warm sitz baths for 20 minutes, 3-4 times a day to ease pain and discomfort. You may do this in a bathtub or using a portable sitz bath that fits over the toilet.  If directed, apply ice to the affected area. Using ice packs between sitz baths may be helpful. ? Put ice in a plastic bag. ? Place a towel between your skin and the bag. ? Leave the ice on for 20 minutes, 2-3 times a day.   General instructions  Take over-the-counter and prescription medicines only as told by your health care provider.  Use medicated creams or suppositories as told.  Get regular exercise. Ask your health care provider how much and what kind of exercise is best for you. In general, you should do moderate exercise for at least 30 minutes on most days of the week (150 minutes each week). This can include activities such as walking, biking, or yoga.  Go to the bathroom when you have the urge to have a bowel movement. Do not wait.  Avoid straining to have bowel movements.  Keep the anal area dry and clean. Use wet toilet paper or moist towelettes after a bowel movement.  Do not sit on the toilet for long periods of time. This increases blood pooling and pain.  Keep all follow-up visits as told by your health care provider. This is important. Contact a health care provider if you have:  Increasing pain and swelling that are not controlled by treatment or medicine.  Difficulty having a bowel movement, or you are unable to have a bowel movement.  Pain or inflammation outside the area of the hemorrhoids. Get help right away if you have:  Uncontrolled bleeding from your rectum. Summary  Hemorrhoids are swollen veins in and around the rectum or anus.  Most hemorrhoids can be managed with home treatments such as diet and lifestyle changes.  Taking warm sitz baths can help ease pain and  discomfort.  In severe cases, procedures or surgery can be done to shrink or remove the hemorrhoids. This information is not intended to replace advice given to you by your health care provider. Make sure you discuss any questions you have with your health care provider. Document Revised: 11/20/2018 Document Reviewed: 11/13/2017 Elsevier Patient Education  [place].

## 2020-11-09 NOTE — Assessment & Plan Note (Signed)
Patient with hemorrhoids, appears to be internal as they are non-painful. Examination deferred today as patient does not have any symptoms with this: no bleeding, stool changes, signs/symptoms of anemia, pain. She was previously referred to GI for screening colonoscopy though reports she has not been contacted yet. We discussed she could have potential treatment of her hemorrhoids during this procedure.  -Encouraged increased fiber intake -Sitz baths as needed -Preparation H prescribed  -F/u with GI for screening colonoscopy

## 2020-12-06 ENCOUNTER — Ambulatory Visit: Payer: Medicaid Other | Admitting: Family Medicine

## 2020-12-13 ENCOUNTER — Ambulatory Visit (INDEPENDENT_AMBULATORY_CARE_PROVIDER_SITE_OTHER): Payer: Medicaid Other | Admitting: Family Medicine

## 2020-12-13 ENCOUNTER — Other Ambulatory Visit: Payer: Self-pay

## 2020-12-13 VITALS — BP 112/68 | HR 77 | Ht 60.0 in | Wt 169.8 lb

## 2020-12-13 DIAGNOSIS — G8929 Other chronic pain: Secondary | ICD-10-CM

## 2020-12-13 DIAGNOSIS — M25561 Pain in right knee: Secondary | ICD-10-CM | POA: Diagnosis not present

## 2020-12-13 DIAGNOSIS — M25562 Pain in left knee: Secondary | ICD-10-CM | POA: Diagnosis not present

## 2020-12-13 DIAGNOSIS — M170 Bilateral primary osteoarthritis of knee: Secondary | ICD-10-CM | POA: Insufficient documentation

## 2020-12-13 NOTE — Patient Instructions (Addendum)
I would like to get x-rays of your knees. Please do the quad strengthening exercises: sets of 15, three times a day for 4 weeks Please follow up next week to review the x-rays: 12/20/20 at 3:15pm  You can use Ibuprofen as needed for pain. Votlaren gel    Dermatology appointment:  01/26/21 at 1:30pm with Dr. Daine Floras  [facility] [address] [place], [place] [address] Phone: [phone]  Ear, nose and Throat appointment:  Dr. Constance Holster on Tuesday, 01/09/21 at 10:25am [address], [address] [phone]

## 2020-12-13 NOTE — Assessment & Plan Note (Addendum)
Chronic. No signs of ligament or meniscal injury however she does endorse "locking" which would raise concern for meniscal injury.  No acute or remote injury to her knees.  Has never had imaging. No signs of acute infection. Some concern for arthritis. No other joint pains to indicate concern for autoimmune pathology.  She does have evidence of quad weakness on exam which I suspect is contributing to the feeling of instability in her knees.   I will obtain bilateral knee x-rays.  Recommended quad strengthening exercises 3 times a day for at least 1 month.  Scheduled patient for follow-up for next week to review x-rays.  Likely follow-up in 1 month to evaluate for improvement.  May benefit from physical therapy.  Pending x-ray results she may benefit from sports medicine referral versus MRI to further evaluate.  Recommended over-the-counter analgesics such as ibuprofen and Voltaren gel as needed for pain.

## 2020-12-13 NOTE — Progress Notes (Addendum)
Subjective:   Patient ID: Nichole Baker    DOB: [birth year], 60 y.o. female   MRN: [id number]  Nichole Baker is a 60 y.o. female with a history of refugee from [place], hemorrhoids, cholesteatoma, skin tag, anxiety, elevated LDL cholesterol here for bilateral knee pain  HPI: Patient presents with chronic bilateral knee concerns.  She notes that she has occasional pain particularly when it is cold or wet but primarily has weakness and feels like her knees are getting give out on her.  She notes some pain and difficulty walking uphill.  Denies any morning stiffness.  She does endorse occasional locking of her knees and takes some time for them to straighten out.  Denies any swelling, injuries, falls, redness or warmth.  Her biggest concern is that she feels like her knees are getting give out on her.  She notes that this is been happening for several years.  Denies any other joint pains other than bilateral ankles that she notes were injured many years ago.  She notes a right ankle fracture about 10 years ago that did not require surgery.  She notes a left ankle injury after fall in the snow about 2 years ago that required casting.  Denies ever having an injury to her knees that involved twisting motion.   Review of Systems:  Per HPI.   Objective:   BP 112/68   Pulse 77   Ht 5' (1.524 m)   Wt 169 lb 12.8 oz (77 kg)   SpO2 96%   BMI 33.16 kg/m  Vitals and nursing note reviewed.  General: pleasant older female, sitting comfortably in exam chair, well nourished, well developed, in no acute distress with non-toxic appearance Resp: breathing comfortably on room air, speaking in full sentences Extremities: warm and well perfused, normal tone, 4/5 strength in lower extremities bilaterally MSK:  Right Knee: - Inspection: no gross deformity. No swelling/effusion, erythema or bruising. Skin intact - Palpation: TTP along lateral joint line, no TTP along patella  - ROM: full active ROM with  flexion and extension in knee and hip - Strength: 4/5 quad strength - Neuro/vasc: NV intact - Special Tests: - LIGAMENTS: negative anterior and posterior drawer, negative Lachman's, no MCL or LCL laxity  -- MENISCUS: negative McMurray's but did have evidence of crepitus with this ROM, negative Thessaly  -- PF JOINT: nml patellar mobility bilaterally. Crepitus with patellar grind  Left Knee: - Inspection: no gross deformity. No swelling/effusion, erythema or bruising. Skin intact - Palpation: TTP along medial joint line, no TTP along patella  - ROM: full active ROM with flexion and extension in knee and hip - Strength: 4/5 quad strength - Neuro/vasc: NV intact - Special Tests: - LIGAMENTS: negative anterior and posterior drawer, negative Lachman's, no MCL or LCL laxity  -- MENISCUS: negative McMurray's but did have evidence of crepitus with this ROM, negative Thessaly  -- PF JOINT: nml patellar mobility bilaterally. Crepitus with patellar grind  Hips: normal ROM, negative FABER and FADIR bilaterally  Neuro: Alert and oriented, speech normal  Assessment & Plan:   Chronic pain of both knees Chronic. No signs of ligament or meniscal injury however she does endorse "locking" which would raise concern for meniscal injury.  No acute or remote injury to her knees.  Has never had imaging. No signs of acute infection. Some concern for arthritis. No other joint pains to indicate concern for autoimmune pathology.  She does have evidence of quad weakness on exam which I suspect is contributing  to the feeling of instability in her knees.   I will obtain bilateral knee x-rays.  Recommended quad strengthening exercises 3 times a day for at least 1 month.  Scheduled patient for follow-up for next week to review x-rays.  Likely follow-up in 1 month to evaluate for improvement.  May benefit from physical therapy.  Pending x-ray results she may benefit from sports medicine referral versus MRI to further  evaluate.  Recommended over-the-counter analgesics such as ibuprofen and Voltaren gel as needed for pain.  Orders Placed This Encounter  Procedures  . DG Knee Complete 4 Views Left    Standing Status:   Future    Standing Expiration Date:   12/13/2021    Order Specific Question:   Reason for Exam (SYMPTOM  OR DIAGNOSIS REQUIRED)    Answer:   knee pain and weakness    Order Specific Question:   Is patient pregnant?    Answer:   No    Order Specific Question:   Preferred imaging location?    Answer:   [facility]  . DG Knee Complete 4 Views Right    Standing Status:   Future    Standing Expiration Date:   12/13/2021    Order Specific Question:   Reason for Exam (SYMPTOM  OR DIAGNOSIS REQUIRED)    Answer:   knee pain and weakness    Order Specific Question:   Is patient pregnant?    Answer:   No    Order Specific Question:   Preferred imaging location?    Answer:   [facility]   No orders of the defined types were placed in this encounter.   Due to language barrier, an interpreter was present during the history-taking and subsequent discussion (and for part of the physical exam) with this patient. In-person Dari interpreter present throughout exam.  Mina Marble, DO PGY-3, [facility] 12/13/2020 1:48 PM

## 2020-12-14 ENCOUNTER — Ambulatory Visit
Admission: RE | Admit: 2020-12-14 | Discharge: 2020-12-14 | Disposition: A | Discharge status: Home / Self Care | Payer: Medicaid Other | Source: Ambulatory Visit | Attending: Family Medicine | Admitting: Family Medicine

## 2020-12-14 DIAGNOSIS — M25562 Pain in left knee: Secondary | ICD-10-CM

## 2020-12-18 NOTE — Progress Notes (Signed)
   Subjective:   Patient ID: Nichole Baker    DOB: Feb 15, 1961, 60 y.o. female   MRN: [id number]  Nichole Baker is a 60 y.o. female with a history of hemorrhoids, skin tag of the ear, cholesteatoma, elevated LDL, anxiety about health here for follow-up of chronic knee pain  Patient presents for follow-up of chronic knee pain.  She had bilateral x-rays that noted tricompartmental degenerative changes.  The left knee did show possible small loose bodies in the lateral compartment.  During last visit she was instructed to perform strengthening exercises.  She notes occasional locking of both knees  which may be from the loose bodies.  This can be a normal variant in patients with arthritis.  It is recommended to complete conservative therapy and refer if worsens or fails to improve with strengthening and physical therapy.  Patient has never had knee injections.  Review of Systems:  Per HPI.   Objective:   BP 127/75   Pulse 91   Wt 168 lb 9.6 oz (76.5 kg)   SpO2 99%   BMI 32.93 kg/m  Vitals and nursing note reviewed.  General: pleasant older female, sitting comfortably in exam chair, well nourished, well developed, in no acute distress with non-toxic appearance Resp: breathing comfortably on room air, speaking in full sentences MSK: gait normal Neuro: Alert and oriented, speech normal  Assessment & Plan:   Degenerative arthritis of knee, bilateral Discussed and reviewed bilateral knee x-rays. Discussed that loose bodies can be a common finding in arthritic joints and often self resolve.  If locking/catching worsens to the point where patient cannot straighten her knee and I recommend that she follow-up for referral to orthopedic surgery.  Otherwise recommended treatment is conservative management.  On my last exam, patient had evidence of bilateral quad weakness.  She has been performing home exercises but does desire referral to physical therapy.  Referral has been placed.  I suspect that  this will improve her symptoms.  Pain is not primary concern however if this becomes an issue I recommend Tylenol/ibuprofen as needed.  Can also consider steroid injections in the future.  Patient voiced understanding and agreement with plan.  Orders Placed This Encounter  Procedures   Ambulatory referral to Physical Therapy    Referral Priority:   Routine    Referral Type:   Physical Medicine    Referral Reason:   Specialty Services Required    Requested Specialty:   Physical Therapy    Number of Visits Requested:   1   No orders of the defined types were placed in this encounter.  Due to language barrier, an interpreter was present during the history-taking and subsequent discussion (and for part of the physical exam) with this patient. In-person Dari Klemme, DO PGY-3, [facility] 12/20/2020 5:01 PM

## 2020-12-20 ENCOUNTER — Ambulatory Visit (INDEPENDENT_AMBULATORY_CARE_PROVIDER_SITE_OTHER): Payer: Medicaid Other | Admitting: Family Medicine

## 2020-12-20 ENCOUNTER — Encounter: Payer: Self-pay | Admitting: Family Medicine

## 2020-12-20 ENCOUNTER — Other Ambulatory Visit: Payer: Self-pay

## 2020-12-20 VITALS — BP 127/75 | HR 91 | Wt 168.6 lb

## 2020-12-20 DIAGNOSIS — M170 Bilateral primary osteoarthritis of knee: Secondary | ICD-10-CM

## 2020-12-20 NOTE — Assessment & Plan Note (Addendum)
Discussed and reviewed bilateral knee x-rays. Discussed that loose bodies can be a common finding in arthritic joints and often self resolve.  If locking/catching worsens to the point where patient cannot straighten her knee and I recommend that she follow-up for referral to orthopedic surgery.  Otherwise recommended treatment is conservative management.  On my last exam, patient had evidence of bilateral quad weakness.  She has been performing home exercises but does desire referral to physical therapy.  Referral has been placed.  I suspect that this will improve her symptoms.  Pain is not primary concern however if this becomes an issue I recommend Tylenol/ibuprofen as needed.  Can also consider steroid injections in the future.  Patient voiced understanding and agreement with plan.

## 2020-12-20 NOTE — Patient Instructions (Signed)
Your x-ray shows arthritis. The best treatment for this is physical therapy. If your locking of your knees worsen to the point you cant straighten them, then Please follow up and we will refer you to orthopedic surgery. With arthritis, this can often improve on its own.  I have placed a referral to physical therapy.  Please expect a phone call from them to schedule.

## 2020-12-25 ENCOUNTER — Encounter: Payer: Self-pay | Admitting: Physician Assistant

## 2021-01-04 ENCOUNTER — Ambulatory Visit: Payer: Medicaid Other

## 2021-01-09 ENCOUNTER — Other Ambulatory Visit: Payer: Self-pay

## 2021-01-09 ENCOUNTER — Ambulatory Visit: Payer: Medicaid Other | Attending: Family Medicine

## 2021-01-09 DIAGNOSIS — M25562 Pain in left knee: Secondary | ICD-10-CM | POA: Diagnosis not present

## 2021-01-09 DIAGNOSIS — M25561 Pain in right knee: Secondary | ICD-10-CM | POA: Diagnosis present

## 2021-01-09 DIAGNOSIS — G8929 Other chronic pain: Secondary | ICD-10-CM | POA: Diagnosis present

## 2021-01-09 DIAGNOSIS — M6281 Muscle weakness (generalized): Secondary | ICD-10-CM

## 2021-01-09 NOTE — Therapy (Signed)
[place], [place], [address] Phone: [phone]   Fax:  [phone]  Physical Therapy Evaluation  Patient Details  Name: Nichole Baker MRN: [id number] Date of Birth: 1960/09/24 Referring Provider (PT): Lind Covert, MD   Encounter Date: 01/09/2021   PT End of Session - 01/09/21 1015     Visit Number 1    Number of Visits 9    Date for PT Re-Evaluation 03/06/21    Authorization Type [employer]    PT Start Time 0917    PT Stop Time 1000    PT Time Calculation (min) 43 min    Activity Tolerance Patient tolerated treatment well;No increased pain    Behavior During Therapy [facility] for tasks assessed/performed             History reviewed. No pertinent past medical history.  History reviewed. No pertinent surgical history.  There were no vitals filed for this visit.    Subjective Assessment - 01/09/21 0911     Subjective Pt presents to PT with reports of bilateral knee pain and locking/buckling. She notes that the L knee is worse than the R in terms of pain and weakness, especially when going down stairs. She has been compliant with the HEP provided by MD office and thinks these have been helping. She also notes pain in bilateral ankles from previous injuries approximately 10 years ago when walking on ice. She denies paresthesias down LEs. Has most pain with prolonged standing or with sitting in prayer positions or when legs are crossed.    Patient is accompained by: Interpreter    Pertinent History chronic bilat knee pain, L>R    Limitations Sitting;Standing    How long can you sit comfortably? sitting crossed leg - 10 minutes; in a chair indefinite    How long can you stand comfortably? 60 minutes    How long can you walk comfortably? indefinite    Diagnostic tests CLINICAL DATA:  Chronic pain.     EXAM:  RIGHT KNEE - COMPLETE 4+ VIEW     COMPARISON:  None     FINDINGS:  Mild to moderate  tricompartmental degenerative changes, worse on the  left. No effusion. No acute fracture.     IMPRESSION:  Mild to moderate tricompartmental degenerative changes; CLINICAL DATA:  Pain and weakness, chronic.     EXAM:  LEFT KNEE - COMPLETE 4+ VIEW     COMPARISON:  None.     FINDINGS:  No acute fracture. No effusion. Mild tricompartmental degenerative  changes. Possible small loose bodies in the lateral compartment.     IMPRESSION:  Mild tricompartmental degenerative changes. Possible small loose  bodies in the lateral compartment.    Patient Stated Goals Pt would like to decrease bilateral knee and ankle pain in order to improve comfort and functional ability    Currently in Pain? Yes    Pain Score 7    7/10 at worse   Pain Location Knee    Pain Orientation Left   R knee 5/10   Pain Type Chronic pain    Pain Onset More than a month ago    Pain Frequency Intermittent    Aggravating Factors  prolonged standing    Pain Relieving Factors rest                [facility] PT Assessment - 01/09/21 0001       Assessment   Medical Diagnosis M17.0 (ICD-10-CM) - Primary osteoarthritis of  both knees    Referring Provider (PT) Lind Covert, MD    Hand Dominance Right    Prior Therapy None      Precautions   Precautions None      Restrictions   Weight Bearing Restrictions No      Balance Screen   Has the patient fallen in the past 6 months No    Has the patient had a decrease in activity level because of a fear of falling?  No    Is the patient reluctant to leave their home because of a fear of falling?  No      Home Environment   Living Environment Private residence    Living Arrangements Other (Comment)   Family   Type of [place] Access Level entry    [place] One level      Prior Function   Level of Independence Independent;Independent with basic ADLs      Cognition   Overall Cognitive Status Within Functional Limits for tasks assessed    Attention Focused       Observation/Other Assessments   Observations red skin irritation noted medially on first ray in bilateral feet    Focus on Therapeutic Outcomes (FOTO)  No FOTO - MCD      Sensation   Light Touch Appears Intact      ROM / Strength   AROM / PROM / Strength AROM;Strength      AROM   Overall AROM Comments bilateral knee AROM WNL      Strength   Right/Left Hip Right;Left    Right Hip Flexion 4+/5    Right Hip ABduction 4/5    Right Hip ADduction 4+/5    Left Hip Flexion 4+/5    Left Hip ABduction 4/5    Left Hip ADduction 4+/5    Right/Left Knee Right;Left    Right Knee Flexion 4/5    Right Knee Extension 5/5    Left Knee Flexion 4/5    Left Knee Extension 5/5    Right/Left Ankle Left;Right    Right Ankle Dorsiflexion 5/5    Right Ankle Plantar Flexion 5/5    Right Ankle Inversion 4/5    Right Ankle Eversion 4/5    Left Ankle Dorsiflexion 5/5    Left Ankle Plantar Flexion 5/5    Left Ankle Inversion 4/5    Left Ankle Eversion 4/5      Palpation   Palpation comment slight TTP to medial patellar poles      Transfers   Comments 30 Sec STS: 12 reps                        Objective measurements completed on examination: See above findings.       [place] Adult PT Treatment/Exercise - 01/09/21 0001       Exercises   Exercises Knee/Hip      Knee/Hip Exercises: Seated   Sit to Sand 10 reps;without UE support      Knee/Hip Exercises: Supine   Straight Leg Raises 10 reps;Both      Knee/Hip Exercises: Sidelying   Hip ABduction 10 reps;Both    Clams x 10 Red TBand                    PT Education - 01/09/21 1014     Education Details eval findings, HEP, POC    Person(s) Educated Patient    Methods Explanation;Demonstration;Handout  Comprehension Verbalized understanding;Returned demonstration              PT Short Term Goals - 01/09/21 1016       PT SHORT TERM GOAL #1   Title Pt will be compliant with initial HEP for improved  carryover and functional ability    Baseline intial HEP given    Time 3    Period Weeks    Status New    Target Date 01/30/21      PT SHORT TERM GOAL #2   Title Pt will increase reps in 30 Sec STS to no less than 15 reps in order to improve LE strength and functional mobility    Baseline 12 reps    Time 8    Period Weeks    Status New    Target Date 03/06/21               PT Long Term Goals - 01/09/21 1017       PT LONG TERM GOAL #1   Title Pt will self report bilateral knee pain no greater than 3/10 at worst in order to improve comfort and functional ability    Baseline 7/10 at worst    Time 8    Period Weeks    Status New    Target Date 03/06/21      PT LONG TERM GOAL #2   Title Pt will be able to complete ADLs in standing with for >2hrs with no increase in LE pain in order to improve functional ability    Baseline 60 minutes current    Time 8    Period Weeks    Status New    Target Date 03/06/21      PT LONG TERM GOAL #3   Title Pt will be able to ambulate up/down 10 stairs with reciprocal pattern and no instances of knee pain or buckling in order to improve safety with community navigation    Baseline unable to perform reciprocally    Time 8    Period Weeks    Status New    Target Date 03/06/21                    Plan - 01/09/21 1020     Clinical Impression Statement Pt is a very pleasant 61 y/o F who presents to PT with reports of chronic bilateral knee pain, L>R. Physical findings are consistent with MD impression, as pt demonstrates decreased proximal hip strength and reduced functional mobility, as evidenced by MMT and 30 Sec STS. She would benefit from skilled PT services working on strengthening of bilateral LEs, with particular emphasis on hip abductors and eccentric quad control, in order to decrease pain and improve mobility. Will assess response to initial HEP and progress as able.    Personal Factors and Comorbidities Fitness;Time since  onset of injury/illness/exacerbation    Examination-Activity Limitations Stand;Squat;Sit;Transfers    Examination-Participation Restrictions Yard Work;Volunteer;Community Activity;Meal Prep    Stability/Clinical Decision Making Stable/Uncomplicated    Clinical Decision Making Low    Rehab Potential Excellent    PT Frequency 1x / week    PT Duration 8 weeks    PT Treatment/Interventions ADLs/Self Care Home Management;Electrical Stimulation;Moist Heat;Cryotherapy;Gait training;Stair training;Functional mobility training;Therapeutic activities;Therapeutic exercise;Balance training;Neuromuscular re-education;Patient/family education;Manual techniques;Dry needling;Taping;Vasopneumatic Device    PT Next Visit Plan assess SLS time; assess response to HEP, progress as able    PT Home Exercise Plan Access Code: [email or web address]    Consulted and Agree with Plan  of Care Patient             Patient will benefit from skilled therapeutic intervention in order to improve the following deficits and impairments:  Decreased activity tolerance, Decreased endurance, Decreased mobility, Decreased strength, Pain  Visit Diagnosis: Chronic pain of left knee - Plan: PT plan of care cert/re-cert  Chronic pain of right knee - Plan: PT plan of care cert/re-cert  Muscle weakness (generalized) - Plan: PT plan of care cert/re-cert     Problem List Patient Active Problem List   Diagnosis Date Noted   Degenerative arthritis of knee, bilateral 12/13/2020   Skin tag of ear 11/09/2020   Hemorrhoids 11/09/2020   Encounter for Papanicolaou smear for cervical cancer screening 10/19/2020   Anxiety about health 10/13/2020   Cholesteatoma 10/13/2020   Elevated LDL cholesterol level 09/29/2020   Left breast mass 08/30/2020   Healthcare maintenance 08/29/2020    Ward Chatters, PT, DPT 01/09/21 10:27 AM  [place] [facility] [address] [place], [place], [address] Phone:  [phone]   Fax:  [phone]  Name: Nichole Baker MRN: [id number] Date of Birth: 1961/04/05  [facility] Authorization   Choose one: Rehabilitative  Standardized Assessment or Functional Outcome Tool: See Pain Assessment and Other 30 Second Sit to Stand  Score or Percent Disability: At risk for falls; decreased functional mobility  Body Parts Treated (Select each separately):  Knee. Overall deficits/functional limitations for body part selected: moderate Ankle. Overall deficits/functional limitations for body part selected: moderate

## 2021-01-18 ENCOUNTER — Ambulatory Visit: Payer: Medicaid Other

## 2021-01-18 ENCOUNTER — Other Ambulatory Visit: Payer: Self-pay

## 2021-01-18 DIAGNOSIS — M6281 Muscle weakness (generalized): Secondary | ICD-10-CM

## 2021-01-18 DIAGNOSIS — G8929 Other chronic pain: Secondary | ICD-10-CM

## 2021-01-18 DIAGNOSIS — M25562 Pain in left knee: Secondary | ICD-10-CM | POA: Diagnosis not present

## 2021-01-19 NOTE — Therapy (Signed)
[place], [place], [address] Phone: [phone]   Fax:  [phone]  Physical Therapy Treatment  Patient Details  Name: Nichole Baker MRN: [id number] Date of Birth: Jan 02, 1961 Referring Provider (PT): Lind Covert, MD   Encounter Date: 01/18/2021   PT End of Session - 01/19/21 0911     Visit Number 2    Number of Visits 9    Date for PT Re-Evaluation 03/06/21    Authorization Type [employer]    PT Start Time 1700    PT Stop Time 1740    PT Time Calculation (min) 40 min    Activity Tolerance Patient tolerated treatment well;No increased pain    Behavior During Therapy [facility] for tasks assessed/performed             No past medical history on file.  No past surgical history on file.  There were no vitals filed for this visit.   Subjective Assessment - 01/19/21 0911     Subjective Pt presents to PT with no current reports of knee pain or discomfort. She has been compliant with her HEP with no adverse effect. She is ready to begin PT treatment at this time.    Patient is accompained by: Interpreter   [phone]   Currently in Pain? No/denies    Pain Score 0-No pain                               [facility] Adult PT Treatment/Exercise - 01/19/21 0001       Knee/Hip Exercises: Aerobic   Nustep lvl 5 UE/LE x 4 min      Knee/Hip Exercises: Standing   Abduction Limitations 2x10 YTB    Extension Limitations 2x10 YTB    Wall Squat 2 sets;10 reps      Knee/Hip Exercises: Supine   Bridges 3 sets;10 reps    Straight Leg Raises 2 sets;15 reps;Both      Knee/Hip Exercises: Sidelying   Hip ABduction Limitations hip abd circles 2x10 ea                    PT Education - 01/18/21 1751     Education Details HEP    Person(s) Educated Patient    Methods Explanation;Demonstration;Handout    Comprehension Verbalized understanding;Returned demonstration               PT Short Term Goals - 01/09/21 1016       PT SHORT TERM GOAL #1   Title Pt will be compliant with initial HEP for improved carryover and functional ability    Baseline intial HEP given    Time 3    Period Weeks    Status New    Target Date 01/30/21      PT SHORT TERM GOAL #2   Title Pt will increase reps in 30 Sec STS to no less than 15 reps in order to improve LE strength and functional mobility    Baseline 12 reps    Time 8    Period Weeks    Status New    Target Date 03/06/21               PT Long Term Goals - 01/09/21 1017       PT LONG TERM GOAL #1   Title Pt will self report bilateral knee pain no greater than 3/10 at worst in order to improve  comfort and functional ability    Baseline 7/10 at worst    Time 8    Period Weeks    Status New    Target Date 03/06/21      PT LONG TERM GOAL #2   Title Pt will be able to complete ADLs in standing with for >2hrs with no increase in LE pain in order to improve functional ability    Baseline 60 minutes current    Time 8    Period Weeks    Status New    Target Date 03/06/21      PT LONG TERM GOAL #3   Title Pt will be able to ambulate up/down 10 stairs with reciprocal pattern and no instances of knee pain or buckling in order to improve safety with community navigation    Baseline unable to perform reciprocally    Time 8    Period Weeks    Status New    Target Date 03/06/21                   Plan - 01/19/21 0913     Clinical Impression Statement Pt was able to complete prescribed exercises with no adverse effect. Therapy today focused on continued LE strengthening . Pt continues to show progression and benefit with skilled PT services. Will continue to progress as tolerated per POC.    PT Treatment/Interventions ADLs/Self Care Home Management;Electrical Stimulation;Moist Heat;Cryotherapy;Gait training;Stair training;Functional mobility training;Therapeutic activities;Therapeutic exercise;Balance  training;Neuromuscular re-education;Patient/family education;Manual techniques;Dry needling;Taping;Vasopneumatic Device    PT Next Visit Plan assess SLS time; assess response to HEP, progress as able    PT Home Exercise Plan Access Code: [email or web address]             Patient will benefit from skilled therapeutic intervention in order to improve the following deficits and impairments:  Decreased activity tolerance, Decreased endurance, Decreased mobility, Decreased strength, Pain  Visit Diagnosis: Chronic pain of left knee  Chronic pain of right knee  Muscle weakness (generalized)     Problem List Patient Active Problem List   Diagnosis Date Noted   Degenerative arthritis of knee, bilateral 12/13/2020   Skin tag of ear 11/09/2020   Hemorrhoids 11/09/2020   Encounter for Papanicolaou smear for cervical cancer screening 10/19/2020   Anxiety about health 10/13/2020   Cholesteatoma 10/13/2020   Elevated LDL cholesterol level 09/29/2020   Left breast mass 08/30/2020   Healthcare maintenance 08/29/2020    Ward Chatters, PT, DPT 01/19/21 9:14 AM  [place] [facility] [address] [place], [place], [address] Phone: [phone]   Fax:  [phone]  Name: Nichole Baker MRN: [id number] Date of Birth: [birth year]

## 2021-01-23 ENCOUNTER — Ambulatory Visit (INDEPENDENT_AMBULATORY_CARE_PROVIDER_SITE_OTHER): Payer: Medicaid Other | Admitting: Physician Assistant

## 2021-01-23 ENCOUNTER — Encounter: Payer: Self-pay | Admitting: Physician Assistant

## 2021-01-23 VITALS — BP 120/72 | HR 86 | Ht 60.0 in | Wt 169.8 lb

## 2021-01-23 DIAGNOSIS — R140 Abdominal distension (gaseous): Secondary | ICD-10-CM

## 2021-01-23 DIAGNOSIS — Z1211 Encounter for screening for malignant neoplasm of colon: Secondary | ICD-10-CM | POA: Diagnosis not present

## 2021-01-23 DIAGNOSIS — R1013 Epigastric pain: Secondary | ICD-10-CM | POA: Diagnosis not present

## 2021-01-23 MED ORDER — PLENVU 140 G PO SOLR: 1.0000 | ORAL | 0 refills | Status: DC

## 2021-01-23 NOTE — Progress Notes (Signed)
Subjective:    Patient ID: Nichole Baker, female    DOB: 12/11/1960, 60 y.o.   MRN: [id number]  HPI Nichole Baker is a 60 year old female from [place] who relocated to the [place] about 9 months ago.  She is referred by the family medicine clinic/Dr. Thompson Grayer. She has not had prior GI evaluation but says that she did have her gallbladder removed about 10 years ago. She comes in with complaints of mild postprandial bloating and discomfort as well as upper abdominal discomfort.  She has not had any nausea or vomiting.  Appetite has been okay, weight has been stable.  She has been having some mild constipation, and occasionally will have some vague discomfort in her left abdomen.  Denies any rectal bleeding.  No ongoing heartburn or indigestion.  She says that she does have an external hemorrhoid which she has had for a while and has a cream to use for that. Family history is negative for colon cancer or polyps as far she is aware. Labs done February 2022 with normal CBC, and unremarkable c-Met, hepatitis B serologies negative, hep C negative, TSH within normal limit. No abdominal imaging.  Review of Systems Pertinent positive and negative review of systems were noted in the above HPI section.  All other review of systems was otherwise negative.   Outpatient Encounter Medications as of 01/23/2021  Medication Sig   calcium carbonate (TUMS) 500 MG chewable tablet Chew 1 tablet (200 mg of elemental calcium total) by mouth daily as needed for indigestion or heartburn.   PEG-KCl-NaCl-NaSulf-Na Asc-C (PLENVU) 140 g SOLR Take 1 kit by mouth as directed.   phenylephrine-shark liver oil-mineral oil-petrolatum (PREPARATION H) 0.25-14-74.9 % rectal ointment Place 1 application rectally 2 (two) times daily as needed for hemorrhoids.   No facility-administered encounter medications on file as of 01/23/2021.   No Known Allergies Patient Active Problem List   Diagnosis Date Noted   Degenerative arthritis of  knee, bilateral 12/13/2020   Skin tag of ear 11/09/2020   Hemorrhoids 11/09/2020   Encounter for Papanicolaou smear for cervical cancer screening 10/19/2020   Anxiety about health 10/13/2020   Cholesteatoma 10/13/2020   Elevated LDL cholesterol level 09/29/2020   Left breast mass 08/30/2020   Healthcare maintenance 08/29/2020   Social History   Socioeconomic History   Marital status: Married    Spouse name: Not on file   Number of children: Not on file   Years of education: Not on file   Highest education level: Not on file  Occupational History   Not on file  Tobacco Use   Smoking status: Never   Smokeless tobacco: Never  Substance and Sexual Activity   Alcohol use: Never   Drug use: Never   Sexual activity: Not on file  Other Topics Concern   Not on file  Social History Narrative   ** Merged History Encounter **       Social Determinants of Health   Financial Resource Strain: Not on file  Food Insecurity: Not on file  Transportation Needs: Not on file  Physical Activity: Not on file  Stress: Not on file  Social Connections: Not on file  Intimate Partner Violence: Not on file    Nichole Baker's family history is not on file.      Objective:    Vitals:   01/23/21 1045  BP: 120/72  Pulse: 86    Physical Exam Well-developed well-nourished female  in no acute distress.  Height, Weight, 169 BMI 33.1 accompanied by  daughter and an interpreter  HEENT; nontraumatic normocephalic, EOMI, PE R LA, sclera anicteric. Oropharynx; not examined today Neck; supple, no JVD Cardiovascular; regular rate and rhythm with S1-S2, no murmur rub or gallop Pulmonary; Clear bilaterally Abdomen; soft, mildly tender in the epigastrium, nondistended, no palpable mass or hepatosplenomegaly, bowel sounds are active Rectal; not done today Skin; benign exam, no jaundice rash or appreciable lesions Extremities; no clubbing cyanosis or edema skin warm and dry Neuro/Psych; alert and  oriented x4, grossly nonfocal mood and affect appropriate        Assessment & Plan:   #68 60 year old female, non-English-speaking from [place] with complaints of multiple month history of epigastric discomfort and postprandial abdominal bloating, mild constipation. No prior colonoscopy-needs colon screening Denies heartburn ,indigestion or dysphagia. Etiology of symptoms is not clear, she is status postcholecystectomy.  Rule out gastritis, H. pylori, peptic ulcer disease, functional dyspepsia  Plan; Patient will be scheduled for colonoscopy and EGD with Dr. Candis Schatz.  Both procedures were discussed in detail with the patient via the interpreter including indications risks and benefits and she is agreeable to proceed. Can biopsy for H. pylori at the time of EGD Further recommendations pending findings at endoscopic evaluation.    Amy Genia Harold PA-C 01/23/2021   Cc: Lenoria Chime, MD

## 2021-01-23 NOTE — Patient Instructions (Addendum)
If you are age 59 or younger, your body mass index should be between 19-25. Your Body mass index is 33.16 kg/m. If this is out of the aformentioned range listed, please consider follow up with your Primary Care Provider.  __________________________________________________________  The [facility] providers would like to encourage you to use [facility] to communicate with providers for non-urgent requests or questions.  Due to long hold times on the telephone, sending your provider a message by [facility] may be a faster and more efficient way to get a response.  Please allow 48 business hours for a response.  Please remember that this is for non-urgent requests.   You have been scheduled for an endoscopy and colonoscopy. Please follow the written instructions given to you at your visit today. Please pick up your prep supplies at the pharmacy within the next 1-3 days. If you use inhalers (even only as needed), please bring them with you on the day of your procedure.  Follow up pending the results of your Colonoscopy/Endoscopy or as needed.  Thank you for entrusting me with your care and choosing [facility].  Amy Esterwood, PA-C

## 2021-01-25 ENCOUNTER — Ambulatory Visit: Payer: Medicaid Other

## 2021-01-25 ENCOUNTER — Other Ambulatory Visit: Payer: Self-pay

## 2021-01-25 DIAGNOSIS — G8929 Other chronic pain: Secondary | ICD-10-CM

## 2021-01-25 DIAGNOSIS — M6281 Muscle weakness (generalized): Secondary | ICD-10-CM

## 2021-01-25 DIAGNOSIS — M25562 Pain in left knee: Secondary | ICD-10-CM

## 2021-01-25 NOTE — Therapy (Signed)
[place], [place], [address] Phone: [phone]   Fax:  [phone]  Physical Therapy Treatment  Patient Details  Name: Nichole Baker MRN: [id number] Date of Birth: [birth year] Referring Provider (PT): Lind Covert, MD   Encounter Date: 01/25/2021   PT End of Session - 01/25/21 1825     Visit Number 3    Number of Visits 9    Date for PT Re-Evaluation 03/06/21    Authorization Type [employer]    PT Start Time 0347    PT Stop Time 1905    PT Time Calculation (min) 38 min    Activity Tolerance Patient tolerated treatment well;No increased pain    Behavior During Therapy [facility] for tasks assessed/performed             No past medical history on file.  Past Surgical History:  Procedure Laterality Date   CHOLECYSTECTOMY      There were no vitals filed for this visit.   Subjective Assessment - 01/25/21 1825     Subjective Pt presents to PT with no current reports of knee pain or discomfort. She has been compliant with HEP with no adverse effect. Pt is ready to begin PT at this time.    Patient is accompained by: Family member   son   Currently in Pain? No/denies    Pain Score 0-No pain                               [facility] Adult PT Treatment/Exercise - 01/25/21 0001       Knee/Hip Exercises: Aerobic   Nustep lvl 5 UE/LE x 4 min      Knee/Hip Exercises: Machines for Strengthening   Cybex Knee Flexion 4x10 35lbs    Total Gym Leg Press 4x10 35lb      Knee/Hip Exercises: Standing   Abduction Limitations 2x15 RTB    Extension Limitations 2x15 RTB    Functional Squat Limitations holding freemotion 3x10    Wall Squat Limitations 2x10 red physioball      Knee/Hip Exercises: Seated   Long Arc Quad 3 sets;10 reps;Both    Long Arc Quad Weight 4 lbs.    Sit to Sand 2 sets;10 reps   holding 10lb KB                     PT Short Term Goals - 01/09/21 1016        PT SHORT TERM GOAL #1   Title Pt will be compliant with initial HEP for improved carryover and functional ability    Baseline intial HEP given    Time 3    Period Weeks    Status New    Target Date 01/30/21      PT SHORT TERM GOAL #2   Title Pt will increase reps in 30 Sec STS to no less than 15 reps in order to improve LE strength and functional mobility    Baseline 12 reps    Time 8    Period Weeks    Status New    Target Date 03/06/21               PT Long Term Goals - 01/09/21 1017       PT LONG TERM GOAL #1   Title Pt will self report bilateral knee pain no greater than 3/10 at worst in order to improve comfort  and functional ability    Baseline 7/10 at worst    Time 8    Period Weeks    Status New    Target Date 03/06/21      PT LONG TERM GOAL #2   Title Pt will be able to complete ADLs in standing with for >2hrs with no increase in LE pain in order to improve functional ability    Baseline 60 minutes current    Time 8    Period Weeks    Status New    Target Date 03/06/21      PT LONG TERM GOAL #3   Title Pt will be able to ambulate up/down 10 stairs with reciprocal pattern and no instances of knee pain or buckling in order to improve safety with community navigation    Baseline unable to perform reciprocally    Time 8    Period Weeks    Status New    Target Date 03/06/21                   Plan - 01/25/21 1909     Clinical Impression Statement Pt was once again able to complete prescribed exercises with no adverse effect. Therapy continues to focus on LE stregnthening with empahsis on quad and proximal hip. Will continue to progress as able and assess some STGs at next session.    PT Treatment/Interventions ADLs/Self Care Home Management;Electrical Stimulation;Moist Heat;Cryotherapy;Gait training;Stair training;Functional mobility training;Therapeutic activities;Therapeutic exercise;Balance training;Neuromuscular  re-education;Patient/family education;Manual techniques;Dry needling;Taping;Vasopneumatic Device    PT Next Visit Plan assess STGs and stairs; progress as able    PT Home Exercise Plan Access Code: [email or web address]             Patient will benefit from skilled therapeutic intervention in order to improve the following deficits and impairments:  Decreased activity tolerance, Decreased endurance, Decreased mobility, Decreased strength, Pain  Visit Diagnosis: Chronic pain of left knee  Chronic pain of right knee  Muscle weakness (generalized)     Problem List Patient Active Problem List   Diagnosis Date Noted   Degenerative arthritis of knee, bilateral 12/13/2020   Skin tag of ear 11/09/2020   Hemorrhoids 11/09/2020   Encounter for Papanicolaou smear for cervical cancer screening 10/19/2020   Anxiety about health 10/13/2020   Cholesteatoma 10/13/2020   Elevated LDL cholesterol level 09/29/2020   Left breast mass 08/30/2020   Healthcare maintenance 08/29/2020    Ward Chatters, PT, DPT 01/25/21 7:11 PM  [place] [facility] [facility] [address] [place], [place], [address] Phone: [phone]   Fax:  [phone]  Name: Nichole Baker MRN: [id number] Date of Birth: [birth year]

## 2021-01-31 ENCOUNTER — Other Ambulatory Visit: Payer: Self-pay

## 2021-01-31 ENCOUNTER — Ambulatory Visit: Payer: Medicaid Other

## 2021-01-31 DIAGNOSIS — M25561 Pain in right knee: Secondary | ICD-10-CM

## 2021-01-31 DIAGNOSIS — M25562 Pain in left knee: Secondary | ICD-10-CM | POA: Diagnosis not present

## 2021-01-31 DIAGNOSIS — M6281 Muscle weakness (generalized): Secondary | ICD-10-CM

## 2021-01-31 DIAGNOSIS — G8929 Other chronic pain: Secondary | ICD-10-CM

## 2021-02-01 NOTE — Therapy (Signed)
[place], [place], [address] Phone: [phone]   Fax:  [phone]  Physical Therapy Treatment  Patient Details  Name: Nichole Baker MRN: [id number] Date of Birth: [birth year] Referring Provider (PT): Lind Covert, MD   Encounter Date: 01/31/2021   PT End of Session - 01/31/21 1823     Visit Number 4    Number of Visits 9    Date for PT Re-Evaluation 03/06/21    Authorization Type [employer]    PT Start Time 1825    PT Stop Time 1910    PT Time Calculation (min) 45 min    Activity Tolerance Patient tolerated treatment well;No increased pain    Behavior During Therapy [facility] for tasks assessed/performed             No past medical history on file.  Past Surgical History:  Procedure Laterality Date   CHOLECYSTECTOMY      There were no vitals filed for this visit.   Subjective Assessment - 02/01/21 0923     Subjective Pt presents to PT with no current reports of knee pain, but does have some bilateral ankle discomfort. She has been compliant with her HEP with no adverse effect and believes her knees are doing much better. Pt is ready to begin PT at this time.    Patient is accompained by: Interpreter    Currently in Pain? Yes    Pain Score 2     Pain Location Ankle    Pain Orientation Right;Left                [facility] PT Assessment - 02/01/21 0001       Transfers   Comments 30 Sec STS: 15 reps      Ambulation/Gait   Stairs Yes    Stairs Assistance 7: Independent    Number of Stairs 10    Height of Stairs 8    Gait Comments able to amb up/down stairs with reciprocal gait, no increase in knee pain                           [facility] Adult PT Treatment/Exercise - 02/01/21 0001       Knee/Hip Exercises: Machines for Strengthening   Total Gym Leg Press 3x10 65lbs      Knee/Hip Exercises: Standing   Extension Limitations 2x10 blue tband    Functional  Squat Limitations holding freemotion 3x10    Wall Squat Limitations 2x10 red physioball    SLS x 30 sec ea    Other Standing Knee Exercises tandem stance x 30 sec ea    Other Standing Knee Exercises lateral walk blue btand x 2 lap in //      Ankle Exercises: Supine   T-Band ankle 4-way x 10ea red tband                      PT Short Term Goals - 01/31/21 1829       PT SHORT TERM GOAL #1   Title Pt will be compliant with initial HEP for improved carryover and functional ability    Baseline intial HEP given    Time 3    Period Weeks    Status Achieved    Target Date 01/30/21      PT SHORT TERM GOAL #2   Title Pt will increase reps in 30 Sec STS to no less than 15 reps  in order to improve LE strength and functional mobility    Baseline 12 reps; 01/31/21 - 15 reps    Time 8    Period Weeks    Status Achieved    Target Date 03/06/21               PT Long Term Goals - 02/01/21 0928       PT LONG TERM GOAL #1   Title Pt will self report bilateral knee pain no greater than 3/10 at worst in order to improve comfort and functional ability    Baseline 7/10 at worst    Time 8    Period Weeks    Status On-going    Target Date 03/06/21      PT LONG TERM GOAL #2   Title Pt will be able to complete ADLs in standing with for >2hrs with no increase in LE pain in order to improve functional ability    Baseline 60 minutes current    Time 8    Period Weeks    Status On-going    Target Date 03/06/21      PT LONG TERM GOAL #3   Title Pt will be able to ambulate up/down 10 stairs with reciprocal pattern and no instances of knee pain or buckling in order to improve safety with community navigation    Baseline unable to perform reciprocally    Time 8    Period Weeks    Status Achieved                   Plan - 02/01/21 0927     Clinical Impression Statement Pt was able to complete prescribed exercises with no adverse effect. Today's session focused on  continued LE strengthening with emphasis on bilat ankle strengthening and improving proprioception. HEP updated for distal LE strengthening and balance exercises. Will continue to assess response to PT and progress as able.    PT Treatment/Interventions ADLs/Self Care Home Management;Electrical Stimulation;Moist Heat;Cryotherapy;Gait training;Stair training;Functional mobility training;Therapeutic activities;Therapeutic exercise;Balance training;Neuromuscular re-education;Patient/family education;Manual techniques;Dry needling;Taping;Vasopneumatic Device    PT Next Visit Plan assess response to new HEP; progress as able    PT Home Exercise Plan Access Code: [email or web address]             Patient will benefit from skilled therapeutic intervention in order to improve the following deficits and impairments:  Decreased activity tolerance, Decreased endurance, Decreased mobility, Decreased strength, Pain  Visit Diagnosis: Chronic pain of left knee  Chronic pain of right knee  Muscle weakness (generalized)     Problem List Patient Active Problem List   Diagnosis Date Noted   Degenerative arthritis of knee, bilateral 12/13/2020   Skin tag of ear 11/09/2020   Hemorrhoids 11/09/2020   Encounter for Papanicolaou smear for cervical cancer screening 10/19/2020   Anxiety about health 10/13/2020   Cholesteatoma 10/13/2020   Elevated LDL cholesterol level 09/29/2020   Left breast mass 08/30/2020   Healthcare maintenance 08/29/2020    Ward Chatters, PT, DPT 02/01/21 9:30 AM  [place], [place], [address] Phone: [phone]   Fax:  [phone]  Name: Angella Snowden MRN: [id number] Date of Birth: 12/27/1960

## 2021-02-02 NOTE — Progress Notes (Signed)
Agree with the assessment and plan as outlined by Amy Esterwood, PA-C.  Khaden Gater E. Jovita Persing, MD  

## 2021-02-08 ENCOUNTER — Other Ambulatory Visit: Payer: Self-pay

## 2021-02-08 ENCOUNTER — Ambulatory Visit: Payer: Medicaid Other

## 2021-02-08 DIAGNOSIS — M25562 Pain in left knee: Secondary | ICD-10-CM | POA: Insufficient documentation

## 2021-02-08 DIAGNOSIS — M6281 Muscle weakness (generalized): Secondary | ICD-10-CM | POA: Insufficient documentation

## 2021-02-08 DIAGNOSIS — G8929 Other chronic pain: Secondary | ICD-10-CM | POA: Insufficient documentation

## 2021-02-08 DIAGNOSIS — M25561 Pain in right knee: Secondary | ICD-10-CM | POA: Insufficient documentation

## 2021-02-08 NOTE — Therapy (Signed)
No charged visit today; patient arrived and reported to therapist that she had teeth pulled in the am this morning and did not feel too well. Provider thought it would be best to hold and reschedule tonight's treatment.  Ward Chatters, PT, DPT 02/08/21 6:38 PM

## 2021-02-15 ENCOUNTER — Ambulatory Visit: Payer: Medicaid Other | Attending: Family Medicine

## 2021-02-15 ENCOUNTER — Other Ambulatory Visit: Payer: Self-pay

## 2021-02-15 DIAGNOSIS — M6281 Muscle weakness (generalized): Secondary | ICD-10-CM | POA: Diagnosis present

## 2021-02-15 DIAGNOSIS — M25562 Pain in left knee: Secondary | ICD-10-CM | POA: Diagnosis present

## 2021-02-15 DIAGNOSIS — M25561 Pain in right knee: Secondary | ICD-10-CM | POA: Diagnosis present

## 2021-02-15 DIAGNOSIS — G8929 Other chronic pain: Secondary | ICD-10-CM | POA: Diagnosis present

## 2021-02-19 NOTE — Therapy (Signed)
[place], [place], [address] Phone: [phone]   Fax:  [phone]  Physical Therapy Treatment  Patient Details  Name: Nichole Baker MRN: [id number] Date of Birth: 06/16/1961 Referring Provider (PT): Lind Covert, MD   Encounter Date: 02/15/2021   PT End of Session - 02/19/21 0821     Visit Number 6    Number of Visits 9    Date for PT Re-Evaluation 03/06/21    Authorization Type [employer]    PT Start Time 1828    PT Stop Time 1915    PT Time Calculation (min) 47 min    Activity Tolerance Patient tolerated treatment well;No increased pain    Behavior During Therapy [facility] for tasks assessed/performed             No past medical history on file.  Past Surgical History:  Procedure Laterality Date   CHOLECYSTECTOMY      There were no vitals filed for this visit.   Subjective Assessment - 02/19/21 0820     Subjective Pt presents to PT with no current reports of pain or discomfort. She has continued to be compliant with HEP with no adverse effect. Pt is ready to begin PT treatment at this time.    Currently in Pain? No/denies    Pain Score 0-No pain                             [facility] Adult PT Treatment/Exercise:   Therapeutic Exercise: NuStep lvl 5 x 5 min UE/LE while taking subjective Lateral/monster walk green tband x 3 laps in // Standing hip ext 2x15 green tband Tandem walk in // x 3 laps SLS 2x45" SLS on foam 2x30" L Squat with black bar bell 2x10 Eccentric heel tap 2x10 ea Supine PPT w/ 90/90 x 20"            PT Short Term Goals - 01/31/21 1829       PT SHORT TERM GOAL #1   Title Pt will be compliant with initial HEP for improved carryover and functional ability    Baseline intial HEP given    Time 3    Period Weeks    Status Achieved    Target Date 01/30/21      PT SHORT TERM GOAL #2   Title Pt will increase reps in 30 Sec STS to  no less than 15 reps in order to improve LE strength and functional mobility    Baseline 12 reps; 01/31/21 - 15 reps    Time 8    Period Weeks    Status Achieved    Target Date 03/06/21               PT Long Term Goals - 02/01/21 0928       PT LONG TERM GOAL #1   Title Pt will self report bilateral knee pain no greater than 3/10 at worst in order to improve comfort and functional ability    Baseline 7/10 at worst    Time 8    Period Weeks    Status On-going    Target Date 03/06/21      PT LONG TERM GOAL #2   Title Pt will be able to complete ADLs in standing with for >2hrs with no increase in LE pain in order to improve functional ability    Baseline 60 minutes current    Time 8  Period Weeks    Status On-going    Target Date 03/06/21      PT LONG TERM GOAL #3   Title Pt will be able to ambulate up/down 10 stairs with reciprocal pattern and no instances of knee pain or buckling in order to improve safety with community navigation    Baseline unable to perform reciprocally    Time 8    Period Weeks    Status Achieved                   Plan - 02/19/21 [id number]     Clinical Impression Statement Pt was able to complete prescribed exercises with no adverse effect. She continues to progress well with therapy, showing improving strength and functional activity tolerance. PT will continue to progress as tolerated per POC.    PT Treatment/Interventions ADLs/Self Care Home Management;Electrical Stimulation;Moist Heat;Cryotherapy;Gait training;Stair training;Functional mobility training;Therapeutic activities;Therapeutic exercise;Balance training;Neuromuscular re-education;Patient/family education;Manual techniques;Dry needling;Taping;Vasopneumatic Device    PT Next Visit Plan assess response to new HEP; progress as able    PT Home Exercise Plan Access Code: [email or web address]             Patient will benefit from skilled therapeutic intervention in order to improve the  following deficits and impairments:  Decreased activity tolerance, Decreased endurance, Decreased mobility, Decreased strength, Pain  Visit Diagnosis: Chronic pain of left knee  Muscle weakness (generalized)  Chronic pain of right knee     Problem List Patient Active Problem List   Diagnosis Date Noted   Degenerative arthritis of knee, bilateral 12/13/2020   Skin tag of ear 11/09/2020   Hemorrhoids 11/09/2020   Encounter for Papanicolaou smear for cervical cancer screening 10/19/2020   Anxiety about health 10/13/2020   Cholesteatoma 10/13/2020   Elevated LDL cholesterol level 09/29/2020   Left breast mass 08/30/2020   Healthcare maintenance 08/29/2020    Ward Chatters 02/19/2021, 8:22 AM  [facility] [address] [place], [place], [address] Phone: [phone]   Fax:  [phone]  Name: Nichole Baker MRN: [id number] Date of Birth: 11/07/1960

## 2021-02-26 ENCOUNTER — Other Ambulatory Visit: Payer: Self-pay

## 2021-02-26 ENCOUNTER — Ambulatory Visit: Payer: Medicaid Other

## 2021-02-26 DIAGNOSIS — M6281 Muscle weakness (generalized): Secondary | ICD-10-CM

## 2021-02-26 DIAGNOSIS — M25562 Pain in left knee: Secondary | ICD-10-CM

## 2021-02-26 DIAGNOSIS — G8929 Other chronic pain: Secondary | ICD-10-CM

## 2021-02-26 NOTE — Therapy (Signed)
[place], [place], [address] Phone: [phone]   Fax:  [phone]  Physical Therapy Treatment  Patient Details  Name: Nichole Baker MRN: [id number] Date of Birth: Nov 09, 1960 Referring Provider (PT): Lind Covert, MD   Encounter Date: 02/26/2021   PT End of Session - 02/26/21 1614     Visit Number 7    Number of Visits 9    Date for PT Re-Evaluation 03/06/21    Authorization Type [employer]    PT Start Time [id number]    PT Stop Time 1700    PT Time Calculation (min) 45 min    Activity Tolerance Patient tolerated treatment well;No increased pain    Behavior During Therapy [facility] for tasks assessed/performed             No past medical history on file.  Past Surgical History:  Procedure Laterality Date   CHOLECYSTECTOMY      There were no vitals filed for this visit.   Subjective Assessment - 02/26/21 1615     Subjective Pt presents to PT with reports of slight L knee pain. She has been compliant with her HEP with no adverse effect. Pt is ready to begin PT treatment at this time,    Currently in Pain? Yes    Pain Score 2     Pain Location Knee    Pain Orientation Left           [facility] Adult PT Treatment/Exercise:   Therapeutic Exercise: NuStep lvl 5 x 5 min UE/LE while taking subjective Step up w/ march x 10 ea Lateral step up x 10 ea Lateral walk blue tband x 3 laps in // Standing hip abd/ext 2x10 blue tband Tandem walk in // x 3 laps STS holding 10lb KB 3x10 SLS on foam 2x30"  Eccentric heel tap 2x10 ea 4"                               PT Short Term Goals - 01/31/21 1829       PT SHORT TERM GOAL #1   Title Pt will be compliant with initial HEP for improved carryover and functional ability    Baseline intial HEP given    Time 3    Period Weeks    Status Achieved    Target Date 01/30/21      PT SHORT TERM GOAL #2   Title Pt will increase  reps in 30 Sec STS to no less than 15 reps in order to improve LE strength and functional mobility    Baseline 12 reps; 01/31/21 - 15 reps    Time 8    Period Weeks    Status Achieved    Target Date 03/06/21               PT Long Term Goals - 02/01/21 0928       PT LONG TERM GOAL #1   Title Pt will self report bilateral knee pain no greater than 3/10 at worst in order to improve comfort and functional ability    Baseline 7/10 at worst    Time 8    Period Weeks    Status On-going    Target Date 03/06/21      PT LONG TERM GOAL #2   Title Pt will be able to complete ADLs in standing with for >2hrs with no increase in LE pain in order to  improve functional ability    Baseline 60 minutes current    Time 8    Period Weeks    Status On-going    Target Date 03/06/21      PT LONG TERM GOAL #3   Title Pt will be able to ambulate up/down 10 stairs with reciprocal pattern and no instances of knee pain or buckling in order to improve safety with community navigation    Baseline unable to perform reciprocally    Time 8    Period Weeks    Status Achieved                   Plan - 02/26/21 1620     Clinical Impression Statement Pt was once again able to complete prescribed exercises with no adverse effect. She continues to progress very well with therapy, with today's session again focusing on increasing LE strength and stability. Will continue to progress as able per POC.    PT Treatment/Interventions ADLs/Self Care Home Management;Electrical Stimulation;Moist Heat;Cryotherapy;Gait training;Stair training;Functional mobility training;Therapeutic activities;Therapeutic exercise;Balance training;Neuromuscular re-education;Patient/family education;Manual techniques;Dry needling;Taping;Vasopneumatic Device    PT Next Visit Plan assess response to new HEP; progress as able    PT Home Exercise Plan Access Code: [email or web address]             Patient will benefit from skilled  therapeutic intervention in order to improve the following deficits and impairments:  Decreased activity tolerance, Decreased endurance, Decreased mobility, Decreased strength, Pain  Visit Diagnosis: Chronic pain of left knee  Muscle weakness (generalized)  Chronic pain of right knee     Problem List Patient Active Problem List   Diagnosis Date Noted   Degenerative arthritis of knee, bilateral 12/13/2020   Skin tag of ear 11/09/2020   Hemorrhoids 11/09/2020   Encounter for Papanicolaou smear for cervical cancer screening 10/19/2020   Anxiety about health 10/13/2020   Cholesteatoma 10/13/2020   Elevated LDL cholesterol level 09/29/2020   Left breast mass 08/30/2020   Healthcare maintenance 08/29/2020    Ward Chatters, PT, DPT 02/26/21 5:50 PM  [facility] [address] [place], [place], [address] Phone: [phone]   Fax:  [phone]  Name: Nichole Baker MRN: [id number] Date of Birth: [birth year]

## 2021-02-28 ENCOUNTER — Encounter: Payer: Medicaid Other | Admitting: Gastroenterology

## 2021-03-08 ENCOUNTER — Ambulatory Visit: Payer: Medicaid Other | Attending: Family Medicine

## 2021-03-08 ENCOUNTER — Other Ambulatory Visit: Payer: Self-pay

## 2021-03-08 DIAGNOSIS — M25562 Pain in left knee: Secondary | ICD-10-CM | POA: Insufficient documentation

## 2021-03-08 DIAGNOSIS — G8929 Other chronic pain: Secondary | ICD-10-CM | POA: Insufficient documentation

## 2021-03-08 DIAGNOSIS — M25561 Pain in right knee: Secondary | ICD-10-CM | POA: Diagnosis present

## 2021-03-08 DIAGNOSIS — M6281 Muscle weakness (generalized): Secondary | ICD-10-CM | POA: Diagnosis present

## 2021-03-08 NOTE — Therapy (Signed)
[place], [place], [address] Phone: [phone]   Fax:  [phone]  Physical Therapy Treatment  Patient Details  Name: Nichole Baker MRN: [id number] Date of Birth: [birth year] Referring Provider (PT): Lind Covert, MD   Encounter Date: 03/08/2021   PT End of Session - 03/08/21 1611     Visit Number 8    Number of Visits 9    Date for PT Re-Evaluation 03/06/21    Authorization Type [employer]    PT Start Time 1615    Activity Tolerance Patient tolerated treatment well;No increased pain    Behavior During Therapy [facility] for tasks assessed/performed             No past medical history on file.  Past Surgical History:  Procedure Laterality Date   CHOLECYSTECTOMY      There were no vitals filed for this visit.   Subjective Assessment - 03/08/21 1612     Subjective Pt presents to PT with no current reports of slight bilat ankle pain. She notes that she and her family went to [place] recently and she had to do a lot of standing. Pt is ready to begin PT at this time.    Currently in Pain? Yes    Pain Score 7     Pain Location Ankle    Pain Orientation Right;Left;Anterior           [facility] Adult PT Treatment/Exercise:   Therapeutic Exercise: NuStep lvl 6 x 3 min UE/LE while taking subjective BOSU ball squats 2x15 SLS on BOSU x 30 sec ea Wobble board 2x10 fwd/bwd/lat  Step up w/ march x 10 ea (not today) Lateral step up x 10 ea (not today) Lateral/monster walk blue tband x 3 laps in // Standing hip abd/ext 2x10 blue tband (not today) Tandem walk in // x 3 laps (not today) STS holding 10lb KB 3x10 SLS on foam 2x30" w/ head turns Eccentric heel tap 2x10 ea 6"                              PT Short Term Goals - 01/31/21 1829       PT SHORT TERM GOAL #1   Title Pt will be compliant with initial HEP for improved carryover and functional ability     Baseline intial HEP given    Time 3    Period Weeks    Status Achieved    Target Date 01/30/21      PT SHORT TERM GOAL #2   Title Pt will increase reps in 30 Sec STS to no less than 15 reps in order to improve LE strength and functional mobility    Baseline 12 reps; 01/31/21 - 15 reps    Time 8    Period Weeks    Status Achieved    Target Date 03/06/21               PT Long Term Goals - 02/01/21 0928       PT LONG TERM GOAL #1   Title Pt will self report bilateral knee pain no greater than 3/10 at worst in order to improve comfort and functional ability    Baseline 7/10 at worst    Time 8    Period Weeks    Status On-going    Target Date 03/06/21      PT LONG TERM GOAL #2   Title Pt  will be able to complete ADLs in standing with for >2hrs with no increase in LE pain in order to improve functional ability    Baseline 60 minutes current    Time 8    Period Weeks    Status On-going    Target Date 03/06/21      PT LONG TERM GOAL #3   Title Pt will be able to ambulate up/down 10 stairs with reciprocal pattern and no instances of knee pain or buckling in order to improve safety with community navigation    Baseline unable to perform reciprocally    Time 8    Period Weeks    Status Achieved                   Plan - 03/08/21 1651     Clinical Impression Statement Pt was once again able to complete prescribed exercises with no adverse effect. She continues to progress well with therpay, with improving stability and LE strength noted. Today we continued to focus on improving balance and stability. PT will continue with current POC with discharge expected at next session on 03/19/21.    PT Treatment/Interventions ADLs/Self Care Home Management;Electrical Stimulation;Moist Heat;Cryotherapy;Gait training;Stair training;Functional mobility training;Therapeutic activities;Therapeutic exercise;Balance training;Neuromuscular re-education;Patient/family education;Manual  techniques;Dry needling;Taping;Vasopneumatic Device    PT Next Visit Plan assess response to new HEP; progress as able    PT Home Exercise Plan Access Code: [email or web address]             Patient will benefit from skilled therapeutic intervention in order to improve the following deficits and impairments:  Decreased activity tolerance, Decreased endurance, Decreased mobility, Decreased strength, Pain  Visit Diagnosis: Chronic pain of left knee  Muscle weakness (generalized)  Chronic pain of right knee     Problem List Patient Active Problem List   Diagnosis Date Noted   Degenerative arthritis of knee, bilateral 12/13/2020   Skin tag of ear 11/09/2020   Hemorrhoids 11/09/2020   Encounter for Papanicolaou smear for cervical cancer screening 10/19/2020   Anxiety about health 10/13/2020   Cholesteatoma 10/13/2020   Elevated LDL cholesterol level 09/29/2020   Left breast mass 08/30/2020   Healthcare maintenance 08/29/2020    Ward Chatters, PT, DPT 03/08/21 4:57 PM  [facility] [address] [place], [place], [address] Phone: [phone]   Fax:  [phone]  Name: Nichole Baker MRN: [id number] Date of Birth: [birth year]

## 2021-03-19 ENCOUNTER — Ambulatory Visit: Payer: Medicaid Other

## 2021-03-19 ENCOUNTER — Other Ambulatory Visit: Payer: Self-pay

## 2021-03-19 DIAGNOSIS — M25562 Pain in left knee: Secondary | ICD-10-CM

## 2021-03-19 DIAGNOSIS — M6281 Muscle weakness (generalized): Secondary | ICD-10-CM

## 2021-03-19 DIAGNOSIS — G8929 Other chronic pain: Secondary | ICD-10-CM

## 2021-03-19 DIAGNOSIS — M25561 Pain in right knee: Secondary | ICD-10-CM

## 2021-03-19 NOTE — Therapy (Addendum)
[place], [place], [address] Phone: [phone]   Fax:  [phone]  Physical Therapy Treatment/Discharge  Patient Details  Name: Nichole Baker MRN: [id number] Date of Birth: [birth year] Referring Provider (PT): Lind Covert, MD   Encounter Date: 03/19/2021   PT End of Session - 03/19/21 1700     Visit Number 9    Number of Visits 9    Date for PT Re-Evaluation 03/06/21    Authorization Type [employer]    PT Start Time 1700    PT Stop Time 7915    PT Time Calculation (min) 34 min    Activity Tolerance Patient tolerated treatment well;No increased pain    Behavior During Therapy [facility] for tasks assessed/performed             No past medical history on file.  Past Surgical History:  Procedure Laterality Date   CHOLECYSTECTOMY      There were no vitals filed for this visit.   Subjective Assessment - 03/19/21 1700     Subjective Pt presents to PT with no current reports of knee or ankle pain. She has been compliant with her HEP with no adverse effect. Pt is ready to begin PT discharge assessment at this time.    Currently in Pain? No/denies    Pain Score 0-No pain           [facility] Adult PT Treatment/Exercise:   Therapeutic Exercise: NuStep lvl 6 x 3 min UE/LE while taking subjective STS x 20  S/L Clamshell blue tband x 20 ea S/L hip abd x 20 ea SLR x 20 ea Bridge x 20 R ankle DF/Ev/Inv x 15 ea Heel raise x 30 SLS x 30 sec ea Standing hip abd/ext blue tband x 15 ea Eccentric heel tap x 10 ea                               PT Short Term Goals - 01/31/21 1829       PT SHORT TERM GOAL #1   Title Pt will be compliant with initial HEP for improved carryover and functional ability    Baseline intial HEP given    Time 3    Period Weeks    Status Achieved    Target Date 01/30/21      PT SHORT TERM GOAL #2   Title Pt will increase reps in 30 Sec  STS to no less than 15 reps in order to improve LE strength and functional mobility    Baseline 12 reps; 01/31/21 - 15 reps    Time 8    Period Weeks    Status Achieved    Target Date 03/06/21               PT Long Term Goals - 03/19/21 1703       PT LONG TERM GOAL #1   Title Pt will self report bilateral knee pain no greater than 3/10 at worst in order to improve comfort and functional ability    Baseline 7/10 at worst    Time 8    Period Weeks    Status Achieved      PT LONG TERM GOAL #2   Title Pt will be able to complete ADLs in standing with for >2hrs with no increase in LE pain in order to improve functional ability    Baseline 60 minutes current  Time 8    Period Weeks    Status Achieved      PT LONG TERM GOAL #3   Title Pt will be able to ambulate up/down 10 stairs with reciprocal pattern and no instances of knee pain or buckling in order to improve safety with community navigation    Baseline unable to perform reciprocally    Time 8    Period Weeks    Status Achieved                   Plan - 03/19/21 1720     Clinical Impression Statement Pt was able to complete prescribed exercises with no adverse effect and demonstrated knowledge of HEP. Over the course of PT treatment, pt progressed very well and was able to meet all goals, showing improved LE strength and balance. Pt has not had any knee or ankle pain in last few weeks and is able to be independent with all ADLs. She should continue to improve with HEP compliance and is being discharged at this time with this in place.    PT Treatment/Interventions ADLs/Self Care Home Management;Electrical Stimulation;Moist Heat;Cryotherapy;Gait training;Stair training;Functional mobility training;Therapeutic activities;Therapeutic exercise;Balance training;Neuromuscular re-education;Patient/family education;Manual techniques;Dry needling;Taping;Vasopneumatic Device    PT Home Exercise Plan Access Code: [email or web address]              Patient will benefit from skilled therapeutic intervention in order to improve the following deficits and impairments:  Decreased activity tolerance, Decreased endurance, Decreased mobility, Decreased strength, Pain  Visit Diagnosis: Chronic pain of left knee  Muscle weakness (generalized)  Chronic pain of right knee     Problem List Patient Active Problem List   Diagnosis Date Noted   Degenerative arthritis of knee, bilateral 12/13/2020   Skin tag of ear 11/09/2020   Hemorrhoids 11/09/2020   Encounter for Papanicolaou smear for cervical cancer screening 10/19/2020   Anxiety about health 10/13/2020   Cholesteatoma 10/13/2020   Elevated LDL cholesterol level 09/29/2020   Left breast mass 08/30/2020   Healthcare maintenance 08/29/2020    Ward Chatters, PT 03/19/2021, 5:42 PM  [facility] [address] [place], [place], [address] Phone: [phone]   Fax:  [phone]  Name: Nichole Baker MRN: [id number] Date of Birth: 01/18/1961  PHYSICAL THERAPY DISCHARGE SUMMARY  Visits from Start of Care: 9  Current functional level related to goals / functional outcomes: See goals and objective   Remaining deficits: See goals and objective   Education / Equipment: HEP   Patient agrees to discharge. Patient goals were met. Patient is being discharged due to meeting the stated rehab goals.

## 2021-03-20 ENCOUNTER — Telehealth: Payer: Self-pay | Admitting: Gastroenterology

## 2021-03-20 NOTE — Telephone Encounter (Signed)
Inbound call from pt's son stating that he needs the instructions for his mother's procedure that's coming up. Pt wants instructions mailed as well as mychart. Please advise. Thank you

## 2021-03-20 NOTE — Telephone Encounter (Signed)
New directions have been printed and put in the mail. Patient's son should be able to see them on MyChart.

## 2021-03-29 ENCOUNTER — Ambulatory Visit (AMBULATORY_SURGERY_CENTER): Payer: Medicaid Other | Admitting: Gastroenterology

## 2021-03-29 ENCOUNTER — Encounter: Payer: Self-pay | Admitting: Gastroenterology

## 2021-03-29 ENCOUNTER — Other Ambulatory Visit: Payer: Self-pay

## 2021-03-29 VITALS — BP 119/70 | HR 66 | Temp 98.9°F | Resp 19 | Ht 60.0 in | Wt 169.0 lb

## 2021-03-29 DIAGNOSIS — D122 Benign neoplasm of ascending colon: Secondary | ICD-10-CM

## 2021-03-29 DIAGNOSIS — K319 Disease of stomach and duodenum, unspecified: Secondary | ICD-10-CM | POA: Diagnosis not present

## 2021-03-29 DIAGNOSIS — D125 Benign neoplasm of sigmoid colon: Secondary | ICD-10-CM

## 2021-03-29 DIAGNOSIS — D123 Benign neoplasm of transverse colon: Secondary | ICD-10-CM

## 2021-03-29 DIAGNOSIS — D120 Benign neoplasm of cecum: Secondary | ICD-10-CM

## 2021-03-29 DIAGNOSIS — Z1211 Encounter for screening for malignant neoplasm of colon: Secondary | ICD-10-CM | POA: Diagnosis not present

## 2021-03-29 DIAGNOSIS — R1013 Epigastric pain: Secondary | ICD-10-CM | POA: Diagnosis not present

## 2021-03-29 DIAGNOSIS — D127 Benign neoplasm of rectosigmoid junction: Secondary | ICD-10-CM

## 2021-03-29 DIAGNOSIS — R140 Abdominal distension (gaseous): Secondary | ICD-10-CM

## 2021-03-29 MED ORDER — SODIUM CHLORIDE 0.9 % IV SOLN: 500.0000 mL | Freq: Once | INTRAVENOUS | Status: DC

## 2021-03-29 NOTE — Progress Notes (Signed)
[facility] History and Physical   Primary Care Physician:  Lenoria Chime, MD   Reason for Procedure:   Colon cancer screening, abdominal pain  Plan:    Screening colonoscopy, diagnostic upper endoscopy with gastric biopsies     HPI: Nichole Baker is a 60 y.o. female from [place] who presents for initial average risk screening colonoscopy and for upper endoscopy to evaluate chronic abdominal pain and bloating.  She is also bothered by hemorrhoids with symptoms of perianal discomfort.  She has mild chronic constipation.   No past medical history on file.  Past Surgical History:  Procedure Laterality Date   CHOLECYSTECTOMY      Prior to Admission medications   Medication Sig Start Date End Date Taking? Authorizing Provider  calcium carbonate (TUMS) 500 MG chewable tablet Chew 1 tablet (200 mg of elemental calcium total) by mouth daily as needed for indigestion or heartburn. 10/13/20   Sharion Settler, DO  PEG-KCl-NaCl-NaSulf-Na Asc-C (PLENVU) 140 g SOLR Take 1 kit by mouth as directed. 01/23/21   Esterwood, Amy S, PA-C  phenylephrine-shark liver oil-mineral oil-petrolatum (PREPARATION H) 0.25-14-74.9 % rectal ointment Place 1 application rectally 2 (two) times daily as needed for hemorrhoids. 11/09/20   Sharion Settler, DO    Current Outpatient Medications  Medication Sig Dispense Refill   calcium carbonate (TUMS) 500 MG chewable tablet Chew 1 tablet (200 mg of elemental calcium total) by mouth daily as needed for indigestion or heartburn. 30 tablet 1   PEG-KCl-NaCl-NaSulf-Na Asc-C (PLENVU) 140 g SOLR Take 1 kit by mouth as directed. 1 each 0   phenylephrine-shark liver oil-mineral oil-petrolatum (PREPARATION H) 0.25-14-74.9 % rectal ointment Place 1 application rectally 2 (two) times daily as needed for hemorrhoids. 28 g 0   Current Facility-Administered Medications  Medication Dose Route Frequency Provider Last Rate Last Admin   0.9 %  sodium chloride  infusion  500 mL Intravenous Once Daryel November, MD        Allergies as of 03/29/2021   (No Known Allergies)    No family history on file.  Social History   Socioeconomic History   Marital status: Married    Spouse name: Not on file   Number of children: Not on file   Years of education: Not on file   Highest education level: Not on file  Occupational History   Not on file  Tobacco Use   Smoking status: Never   Smokeless tobacco: Never  Substance and Sexual Activity   Alcohol use: Never   Drug use: Never   Sexual activity: Not on file  Other Topics Concern   Not on file  Social History Narrative   ** Merged History Encounter **       Social Determinants of Health   Financial Resource Strain: Not on file  Food Insecurity: Not on file  Transportation Needs: Not on file  Physical Activity: Not on file  Stress: Not on file  Social Connections: Not on file  Intimate Partner Violence: Not on file    Review of Systems:  All other review of systems negative except as mentioned in the HPI.  Physical Exam: Vital signs BP (!) 152/65   Pulse (!) 109   Temp 98.9 F (37.2 C)   Ht 5' (1.524 m)   Wt 169 lb (76.7 kg)   SpO2 96%   BMI 33.01 kg/m   General:   Alert,  Well-developed, well-nourished, pleasant and cooperative in NAD, MP2 Lungs:  Clear throughout to auscultation.  Heart:  Regular rate and rhythm; no murmurs, clicks, rubs,  or gallops. Abdomen:  Soft, nontender and nondistended. Normal bowel sounds.   Neuro/Psych:  Normal mood and affect. A and O x 3  In-person interpretor services used for this encounter   Dayson Aboud E. Candis Schatz, MD [facility]

## 2021-03-29 NOTE — Progress Notes (Signed)
VS completed by CW.   Interpreter used today at the [facility] for this pt.  Interpreter's name is- [place] history reviewed and updated.

## 2021-03-29 NOTE — Op Note (Signed)
[place] Patient Name: Nichole Baker Procedure Date: 03/29/2021 1:10 PM MRN: [id number] Endoscopist: Nicki Reaper E. Candis Schatz , MD Age: 60 Referring MD:  Date of Birth: 1961/06/07 Gender: Female Account #: [id number] Procedure:                Upper GI endoscopy Indications:              Generalized abdominal pain, Abdominal bloating Medicines:                Monitored Anesthesia Care Procedure:                Pre-Anesthesia Assessment:                           - Prior to the procedure, a History and Physical                            was performed, and patient medications and                            allergies were reviewed. The patient's tolerance of                            previous anesthesia was also reviewed. The risks                            and benefits of the procedure and the sedation                            options and risks were discussed with the patient.                            All questions were answered, and informed consent                            was obtained. Prior Anticoagulants: The patient has                            taken no previous anticoagulant or antiplatelet                            agents. ASA Grade Assessment: II - A patient with                            mild systemic disease. After reviewing the risks                            and benefits, the patient was deemed in                            satisfactory condition to undergo the procedure.                           After obtaining informed consent, the endoscope was  passed under direct vision. Throughout the                            procedure, the patient's blood pressure, pulse, and                            oxygen saturations were monitored continuously. The                            Endoscope was introduced through the mouth, and                            advanced to the third part of duodenum. The upper                            GI  endoscopy was accomplished without difficulty.                            The patient tolerated the procedure well. Scope In: Scope Out: Findings:                 The examined portions of the nasopharynx,                            oropharynx and larynx were normal.                           The examined esophagus was normal.                           The gastroesophageal flap valve was visualized                            endoscopically and classified as Hill Grade III                            (minimal fold, loose to endoscope, hiatal hernia                            likely).                           The entire examined stomach was normal. Biopsies                            were taken with a cold forceps for Helicobacter                            pylori testing. Estimated blood loss was minimal.                           The examined duodenum was normal. Complications:            No immediate complications. Estimated Blood Loss:     Estimated blood loss was minimal. Impression:               -  The examined portions of the nasopharynx,                            oropharynx and larynx were normal.                           - Normal esophagus.                           - Gastroesophageal flap valve classified as Hill                            Grade III (minimal fold, loose to endoscope, hiatal                            hernia likely).                           - Normal stomach. Biopsied.                           - Normal examined duodenum.                           - No endoscopic abnormalities to explain patient's                            abdomninal pain. Await testing for H. pylori. Recommendation:           - Patient has a contact number available for                            emergencies. The signs and symptoms of potential                            delayed complications were discussed with the                            patient. Return to normal activities tomorrow.                             Written discharge instructions were provided to the                            patient.                           - Resume previous diet.                           - Continue present medications.                           - Await pathology results. Isela Stantz E. Candis Schatz, MD 03/29/2021 2:26:35 PM This report has been signed electronically.

## 2021-03-29 NOTE — Patient Instructions (Addendum)
Information on polyps and hemorrhoids given to you today.  Await pathology results.  Resume previous diet and medications.  Recommend taking Metamucil (fiber supplement) and hydrocortisone as needed for hemorrhoids.  Repeat colonoscopy in 1 year for surveillance.   YOU HAD AN ENDOSCOPIC PROCEDURE TODAY AT [facility]:   Refer to the procedure report that was given to you for any specific questions about what was found during the examination.  If the procedure report does not answer your questions, please call your gastroenterologist to clarify.  If you requested that your care partner not be given the details of your procedure findings, then the procedure report has been included in a sealed envelope for you to review at your convenience later.  YOU SHOULD EXPECT: Some feelings of bloating in the abdomen. Passage of more gas than usual.  Walking can help get rid of the air that was put into your GI tract during the procedure and reduce the bloating. If you had a lower endoscopy (such as a colonoscopy or flexible sigmoidoscopy) you may notice spotting of blood in your stool or on the toilet paper. If you underwent a bowel prep for your procedure, you may not have a normal bowel movement for a few days.  Please Note:  You might notice some irritation and congestion in your nose or some drainage.  This is from the oxygen used during your procedure.  There is no need for concern and it should clear up in a day or so.  SYMPTOMS TO REPORT IMMEDIATELY:  Following lower endoscopy (colonoscopy or flexible sigmoidoscopy):  Excessive amounts of blood in the stool  Significant tenderness or worsening of abdominal pains  Swelling of the abdomen that is new, acute  Fever of 100F or higher  Following upper endoscopy (EGD)  Vomiting of blood or coffee ground material  New chest pain or pain under the shoulder blades  Painful or persistently difficult swallowing  New shortness of  breath  Fever of 100F or higher  Black, tarry-looking stools  For urgent or emergent issues, a gastroenterologist can be reached at any hour by calling [phone]. Do not use MyChart messaging for urgent concerns.    DIET:  We do recommend a small meal at first, but then you may proceed to your regular diet.  Drink plenty of fluids but you should avoid alcoholic beverages for 24 hours.  ACTIVITY:  You should plan to take it easy for the rest of today and you should NOT DRIVE or use heavy machinery until tomorrow (because of the sedation medicines used during the test).    FOLLOW UP: Our staff will call the number listed on your records 48-72 hours following your procedure to check on you and address any questions or concerns that you may have regarding the information given to you following your procedure. If we do not reach you, we will leave a message.  We will attempt to reach you two times.  During this call, we will ask if you have developed any symptoms of COVID 19. If you develop any symptoms (ie: fever, flu-like symptoms, shortness of breath, cough etc.) before then, please call [phone].  If you test positive for Covid 19 in the 2 weeks post procedure, please call and report this information to [place].    If any biopsies were taken you will be contacted by phone or by letter within the next 1-3 weeks.  Please call us at [phone] if you have not heard about  the biopsies in 3 weeks.    SIGNATURES/CONFIDENTIALITY: You and/or your care partner have signed paperwork which will be entered into your electronic medical record.  These signatures attest to the fact that that the information above on your After Visit Summary has been reviewed and is understood.  Full responsibility of the confidentiality of this discharge information lies with you and/or your care-partner.

## 2021-03-29 NOTE — Op Note (Signed)
[place] Patient Name: Nichole Baker Procedure Date: 03/29/2021 1:10 PM MRN: [id number] Endoscopist: Nicki Reaper E. Candis Schatz , MD Age: 60 Referring MD:  Date of Birth: 30-Jun-1961 Gender: Female Account #: [id number] Procedure:                Colonoscopy Indications:              Screening for colorectal malignant neoplasm, This                            is the patient's first colonoscopy Medicines:                Monitored Anesthesia Care Procedure:                Pre-Anesthesia Assessment:                           - Prior to the procedure, a History and Physical                            was performed, and patient medications and                            allergies were reviewed. The patient's tolerance of                            previous anesthesia was also reviewed. The risks                            and benefits of the procedure and the sedation                            options and risks were discussed with the patient.                            All questions were answered, and informed consent                            was obtained. Prior Anticoagulants: The patient has                            taken no previous anticoagulant or antiplatelet                            agents. ASA Grade Assessment: II - A patient with                            mild systemic disease. After reviewing the risks                            and benefits, the patient was deemed in                            satisfactory condition to undergo the procedure.  After obtaining informed consent, the colonoscope                            was passed under direct vision. Throughout the                            procedure, the patient's blood pressure, pulse, and                            oxygen saturations were monitored continuously. The                            CF [id number] #[id number] was introduced through the anus                            and advanced to the  the terminal ileum, with                            identification of the appendiceal orifice and IC                            valve. The colonoscopy was performed without                            difficulty. The patient tolerated the procedure                            well. The quality of the bowel preparation was                            excellent. The terminal ileum, ileocecal valve,                            appendiceal orifice, and rectum were photographed.                            The bowel preparation used was Plenvu via split                            dose instruction. Scope In: 1:51:42 PM Scope Out: 2:22:21 PM Scope Withdrawal Time: 0 hours 28 minutes 12 seconds  Total Procedure Duration: 0 hours 30 minutes 39 seconds  Findings:                 Skin tags were found on perianal exam.                           The perianal and digital rectal examinations were                            normal. Pertinent negatives include normal                            sphincter tone and no palpable rectal lesions.  A 2 mm polyp was found in the cecum. The polyp was                            sessile. The polyp was removed with a jumbo cold                            forceps. Resection and retrieval were complete.                            Estimated blood loss was minimal.                           Two sessile polyps were found in the ascending                            colon. The polyps were 2 to 3 mm in size. These                            polyps were removed with a jumbo cold forceps.                            Resection and retrieval were complete. Estimated                            blood loss was minimal.                           Two sessile polyps were found in the ascending                            colon. The polyps were 3 to 6 mm in size. These                            polyps were removed with a cold snare. Resection                             and retrieval were complete. Estimated blood loss                            was minimal.                           Six sessile and semi-pedunculated polyps were found                            in the transverse colon. The polyps were 3 to 12 mm                            in size. These polyps were removed with a cold                            snare. Resection and retrieval were complete.  Estimated blood loss was minimal.                           A 4 mm polyp was found in the splenic flexure. The                            polyp was sessile. The polyp was removed with a                            cold snare. Resection and retrieval were complete.                            Estimated blood loss was minimal.                           A 12 mm polyp was found in the sigmoid colon. The                            polyp was sessile. The polyp was removed with a                            cold snare. Resection and retrieval were complete.                            Estimated blood loss was minimal.                           Two sessile polyps were found in the sigmoid colon.                            The polyps were 3 mm in size. These polyps were                            removed with a cold snare. Resection and retrieval                            were complete. Estimated blood loss was minimal.                           The exam was otherwise normal throughout the                            examined colon.                           The terminal ileum appeared normal.                           The retroflexed view of the distal rectum and anal                            verge was normal and showed no anal or rectal  abnormalities. Complications:            No immediate complications. Estimated Blood Loss:     Estimated blood loss was minimal. Impression:               - Perianal skin tags found on perianal exam.                            - One 2 mm polyp in the cecum, removed with a jumbo                            cold forceps. Resected and retrieved.                           - Two 2 to 3 mm polyps in the ascending colon,                            removed with a jumbo cold forceps. Resected and                            retrieved.                           - Two 3 to 6 mm polyps in the ascending colon,                            removed with a cold snare. Resected and retrieved.                           - Six 3 to 12 mm polyps in the transverse colon,                            removed with a cold snare. Resected and retrieved.                           - One 4 mm polyp at the splenic flexure, removed                            with a cold snare. Resected and retrieved.                           - One 12 mm polyp in the sigmoid colon, removed                            with a cold snare. Resected and retrieved.                           - Two 3 mm polyps in the sigmoid colon, removed                            with a cold snare. Resected and retrieved.                           -  The examined portion of the ileum was normal.                           - The distal rectum and anal verge are normal on                            retroflexion view.                           - No significantly enlarge internal hemorrhoids                            seen on exam today. Recommendation:           - Patient has a contact number available for                            emergencies. The signs and symptoms of potential                            delayed complications were discussed with the                            patient. Return to normal activities tomorrow.                            Written discharge instructions were provided to the                            patient.                           - Resume previous diet.                           - Continue present medications.                           - Await pathology  results.                           - Repeat colonoscopy in 1 year for surveillance.                           - Recommend daily Metamucil and PRN hydrocortisone                            cream for symptomatic hemorrhoids. Dyson Sevey E. Candis Schatz, MD 03/29/2021 2:35:28 PM This report has been signed electronically.

## 2021-03-29 NOTE — Progress Notes (Signed)
A and O x3. Report to RN. Tolerated MAC anesthesia well.Teeth unchanged after procedure. 

## 2021-03-29 NOTE — Progress Notes (Signed)
Called to room to assist during endoscopic procedure.  Patient ID and intended procedure confirmed with present staff. Received instructions for my participation in the procedure from the performing physician.  

## 2021-04-02 ENCOUNTER — Telehealth: Payer: Self-pay

## 2021-04-02 NOTE — Telephone Encounter (Signed)
Attempted to reach pt with follow-up call following endoscopic procedure 03/29/2021.   Pt.'s voice mail not set up.  Unable to LM.  Will try to reach pt. Again later today.

## 2021-04-02 NOTE — Telephone Encounter (Signed)
No answer, unable to leave a message, B.Connelly Spruell RN. 

## 2021-04-04 ENCOUNTER — Telehealth: Payer: Self-pay | Admitting: Gastroenterology

## 2021-04-04 NOTE — Telephone Encounter (Signed)
Inbound call from patient son. Patient had Endo/Colon 9/22. States she is experiencing pain since 9/23. Best contact number [phone]

## 2021-04-04 NOTE — Telephone Encounter (Signed)
Left message for patient to call back  

## 2021-04-07 ENCOUNTER — Encounter: Payer: Self-pay | Admitting: Gastroenterology

## 2021-04-09 NOTE — Telephone Encounter (Signed)
No return calls.  I will await a call from patient or family

## 2021-04-23 NOTE — Progress Notes (Signed)
    SUBJECTIVE:   CHIEF COMPLAINT / HPI:   Had EGD that was unremarkable and biopsy negative for H pylori. Also had colonscopy with several tubular adenomas- recommended repeat colonoscopy in one year for surveillance.  Is not had abdominal pain since then.  Also has no longer had constipation.  Hemorrhoids-notes she is wondering if she needs surgery for these.  She has had them since her first child.  Sometimes they burn or itch, but not that often she feels like the Preparation H cream that we have given her helps.  She is no longer having constipation, and denies any blood in her stool.  Recent colonoscopy without large internal hemorrhoids.  Hyperglycemia-was at a festival and was told her blood sugar was 137.  On her CMP here previously she has had a blood sugar of 116.  Dari speaking interpreter [facility] interpreting for entire visit.  OBJECTIVE:   BP 123/82   Pulse 82   Ht 5' (1.524 m)   Wt 166 lb 12.8 oz (75.7 kg)   SpO2 99%   BMI 32.58 kg/m   Rectal: Several skin tags noted, no anal fissure or hemorrhoid appreciated, no rashes or masses.  Interpreter Mona present for rectal exam  ASSESSMENT/PLAN:   Prediabetes - Discussed diet and exercise today, no indication for medications at this time  Hemorrhoids - No large hemorrhoids noted today, continue Preparation H, no indication for surgery referral at this time   Already had flu shot this year.  Lenoria Chime, MD [place]

## 2021-04-27 ENCOUNTER — Ambulatory Visit (INDEPENDENT_AMBULATORY_CARE_PROVIDER_SITE_OTHER): Payer: Medicaid Other | Admitting: Family Medicine

## 2021-04-27 ENCOUNTER — Other Ambulatory Visit: Payer: Self-pay

## 2021-04-27 VITALS — BP 123/82 | HR 82 | Ht 60.0 in | Wt 166.8 lb

## 2021-04-27 DIAGNOSIS — R7303 Prediabetes: Secondary | ICD-10-CM | POA: Insufficient documentation

## 2021-04-27 DIAGNOSIS — R739 Hyperglycemia, unspecified: Secondary | ICD-10-CM

## 2021-04-27 DIAGNOSIS — K649 Unspecified hemorrhoids: Secondary | ICD-10-CM | POA: Diagnosis not present

## 2021-04-27 LAB — POCT GLYCOSYLATED HEMOGLOBIN (HGB A1C): Hemoglobin A1C: 6.2 % — AB (ref 4.0–5.6)

## 2021-04-27 NOTE — Assessment & Plan Note (Signed)
-   No large hemorrhoids noted today, continue Preparation H, no indication for surgery referral at this time

## 2021-04-27 NOTE — Assessment & Plan Note (Signed)
-   Discussed diet and exercise today, no indication for medications at this time

## 2021-04-27 NOTE — Patient Instructions (Addendum)
It was wonderful to see you today.  Please bring ALL of your medications with you to every visit.   Today we talked about:  -You are in the prediabetes range, continue to eat foods low in sugars and eat plenty of fruits and vegetables - You have some skin tags but no large hemorrhoids, I do not think he needs surgery, continue to use the Preparation H as needed and let us know if it gets worse - make a follow up in 2-3 months to recheck your diet   Thank you for choosing [facility].   Please call [phone] with any questions about today's appointment.  Please be sure to schedule follow up at the front  desk before you leave today.   Yehuda Savannah, MD  Family Medicine

## 2021-07-10 ENCOUNTER — Ambulatory Visit: Payer: Medicaid Other | Admitting: Family Medicine

## 2021-07-11 ENCOUNTER — Other Ambulatory Visit: Payer: Self-pay

## 2021-07-11 ENCOUNTER — Ambulatory Visit (INDEPENDENT_AMBULATORY_CARE_PROVIDER_SITE_OTHER): Payer: Medicaid Other | Admitting: Family Medicine

## 2021-07-11 ENCOUNTER — Encounter: Payer: Self-pay | Admitting: Family Medicine

## 2021-07-11 VITALS — BP 125/68 | HR 76 | Ht 60.0 in | Wt 162.4 lb

## 2021-07-11 DIAGNOSIS — Z23 Encounter for immunization: Secondary | ICD-10-CM

## 2021-07-11 DIAGNOSIS — R7303 Prediabetes: Secondary | ICD-10-CM | POA: Diagnosis not present

## 2021-07-11 DIAGNOSIS — D171 Benign lipomatous neoplasm of skin and subcutaneous tissue of trunk: Secondary | ICD-10-CM

## 2021-07-11 NOTE — Patient Instructions (Signed)
It was wonderful to see you today.  Please bring ALL of your medications with you to every visit.   Today we talked about:  - We schedule an US of the spot on your back on Wednesday January 11th at 2:00 PM at Entrance [facility] of [facility] - Pneumonia vaccine given today - Please schedule a follow up in March to recheck your A1c   Thank you for choosing [facility].   Please call [phone] with any questions about today's appointment.  Please be sure to schedule follow up at the front  desk before you leave today.   Yehuda Savannah, MD  Family Medicine

## 2021-07-11 NOTE — Assessment & Plan Note (Signed)
-   Suspect this is a lipoma or hamartoma based on physical exam, nonpainful, not growing, no over whelming or concerning skin changes, with shared decision-making patient decided would like an ultrasound just to be reassured, scheduled today

## 2021-07-11 NOTE — Assessment & Plan Note (Signed)
-   Discussed diet and exercise changes, follow-up in 3 months and can repeat A1c at that time

## 2021-07-11 NOTE — Progress Notes (Signed)
° ° °  SUBJECTIVE:   CHIEF COMPLAINT / HPI:   prediabetes-today she would like to discuss things that she can do for her prediabetes.  She had A1c of 6.2 in October 2021.  She has lost 4 pounds since last visit.  She walks every day for 40 minutes.  She has been eating more protein and vegetables and less rice.  She was wondering about dried fruits or what kinds of fruits are okay.  Mass on back-she notes for a long time she has had this mass in her left lower back, it is not painful or growing.  She is wondering what it is.  She had a work-up for the mass of her breast which was shown to be a hamartoma, she is wanting to do an ultrasound or other work-up for the mass on her back.  No history of trauma to that area.  Left breast mass-previous ultrasound and mammogram showing hamartoma, recommend repeat annual screening.  She will be due in April 2023.  She notes it is not growing or painful.  PERTINENT  PMH / PSH: Prediabetes  OBJECTIVE:   BP 125/68    Pulse 76    Ht 5' (1.524 m)    Wt 162 lb 6.4 oz (73.7 kg)    SpO2 99%    BMI 31.72 kg/m   General: A&O, NAD HEENT: No sign of trauma, EOM grossly intact Cardiac: RRR, no m/r/g Respiratory: CTAB, normal WOB, no w/c/r GI: Soft, NTTP, non-distended  Extremities: NTTP, no peripheral edema. MSK: On the left upper back below scapula, a firm mobile 5 x 6 cm area, consistent with lipoma versus hamartoma, no overlying skin rash, nontender to palpation Neuro: Normal gait, moves all four extremities appropriately. Psych: Appropriate mood and affect  Interpreter Lilyan Punt present as chaperone. 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 Will schedule screening mammogram at follow-up appointment.  Lenoria Chime, MD [place]

## 2021-07-18 ENCOUNTER — Ambulatory Visit (HOSPITAL_COMMUNITY)
Admission: RE | Admit: 2021-07-18 | Discharge: 2021-07-18 | Disposition: A | Discharge status: Home / Self Care | Payer: Medicaid Other | Source: Ambulatory Visit | Attending: Family Medicine | Admitting: Family Medicine

## 2021-07-18 ENCOUNTER — Other Ambulatory Visit: Payer: Self-pay

## 2021-07-18 DIAGNOSIS — D171 Benign lipomatous neoplasm of skin and subcutaneous tissue of trunk: Secondary | ICD-10-CM | POA: Diagnosis not present

## 2021-09-11 NOTE — Progress Notes (Signed)
? ? ?  SUBJECTIVE:  ? ?CHIEF COMPLAINT / HPI:  ?Dari interpretor used for entirety of visit. ? ?Prediabetes- last A1c 6.2. Had previously discussed lifestyle changes. A1c today is 6.1, and she has lost 4 lbs. ? ?Elevated LDL- 187 one year ago. Discussed lifestyle and exercise changes. Offered to recheck today. ? 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 Not growing or painful. Is okay with doing it during Ramadan. ? ?Subcutaneous lipoma of torso- noted on [place], offered previous surgery referral and she wanted to think about it. She would like to see surgeon for removal as it hurts sometimes. Not growing. ? ?PERTINENT  PMH / PSH: prediabetes ? ?OBJECTIVE:  ? ?BP 122/64   Pulse 68   Ht 5' (1.524 m)   Wt 158 lb 6.4 oz (71.8 kg)   SpO2 98%   BMI 30.94 kg/m?   ?General: alert & oriented, no apparent distress, well groomed ?HEENT: normocephalic, atraumatic, EOM grossly intact, oral mucosa moist, neck supple ?Cardiac: RRR, no m/r/g ?Respiratory: normal respiratory effort, lungs CTAB ?GI: non-distended ?Skin: no rashes, no jaundice ?MSK: firm mobile mass palpated on left upper back, non-tender ?Psych: appropriate mood and affect ? ? ?ASSESSMENT/PLAN:  ? ?Prediabetes ?- discussed diet and exercise changes today, congratulated her on 4 lb weight loss, A1c stable and slightly improved, recheck in 6 months ? ?Lipoma of torso ?- referral to general surgery placed today ? ?Elevated LDL cholesterol level ?Checking LDL today ? ?Left breast mass ?Screening mammogram ordered today,  due in April 2023, previously noted to be hamartoma ?  ? ?Lenoria Chime, MD ?[place]  ? ?

## 2021-09-12 ENCOUNTER — Other Ambulatory Visit: Payer: Self-pay | Admitting: Family Medicine

## 2021-09-12 ENCOUNTER — Other Ambulatory Visit: Payer: Self-pay

## 2021-09-12 ENCOUNTER — Ambulatory Visit (INDEPENDENT_AMBULATORY_CARE_PROVIDER_SITE_OTHER): Payer: Medicaid Other | Admitting: Family Medicine

## 2021-09-12 VITALS — BP 122/64 | HR 68 | Ht 60.0 in | Wt 158.4 lb

## 2021-09-12 DIAGNOSIS — Z1231 Encounter for screening mammogram for malignant neoplasm of breast: Secondary | ICD-10-CM

## 2021-09-12 DIAGNOSIS — D171 Benign lipomatous neoplasm of skin and subcutaneous tissue of trunk: Secondary | ICD-10-CM | POA: Diagnosis not present

## 2021-09-12 DIAGNOSIS — R7303 Prediabetes: Secondary | ICD-10-CM | POA: Diagnosis present

## 2021-09-12 DIAGNOSIS — E7800 Pure hypercholesterolemia, unspecified: Secondary | ICD-10-CM | POA: Diagnosis not present

## 2021-09-12 LAB — POCT GLYCOSYLATED HEMOGLOBIN (HGB A1C): HbA1c, POC (controlled diabetic range): 6.1 % (ref 0.0–7.0)

## 2021-09-12 NOTE — Assessment & Plan Note (Signed)
Checking LDL today.  

## 2021-09-12 NOTE — Assessment & Plan Note (Addendum)
-   discussed diet and exercise changes today, congratulated her on 4 lb weight loss, A1c stable and slightly improved, recheck in 6 months ?

## 2021-09-12 NOTE — Assessment & Plan Note (Signed)
-   referral to general surgery placed today ?

## 2021-09-12 NOTE — Patient Instructions (Addendum)
It was wonderful to see you today. ? ?Please bring ALL of your medications with you to every visit.  ? ?Today we talked about: ? ?We checked your A1c, it was 6.1 good job! ?Checking cholesterol today ? ?Ordered mammogram today! ? ?Referral to surgeon today ? ?Thank you for choosing [facility].  ? ?Please call [phone] with any questions about today's appointment. ? ?Please be sure to schedule follow up at the front  desk before you leave today.  ? ?Please arrive at least 15 minutes prior to your scheduled appointments. ?  ?If you had blood work today, I will send you a MyChart message or a letter if results are normal. Otherwise, I will give you a call. ?  ?If you had a referral placed, they will call you to set up an appointment. Please give [place] a call if you don't hear back in the next 2 weeks. ?  ?If you need additional refills before your next appointment, please call your pharmacy first.  ? ?Yehuda Savannah, MD  ?Family Medicine  ?

## 2021-09-12 NOTE — Assessment & Plan Note (Signed)
Screening mammogram ordered today ?

## 2021-09-13 LAB — LIPID PANEL
Chol/HDL Ratio: 5.4 ratio — ABNORMAL HIGH (ref 0.0–4.4)
Cholesterol, Total: 238 mg/dL — ABNORMAL HIGH (ref 100–199)
HDL: 44 mg/dL (ref >39)
LDL Chol Calc (NIH): 167 mg/dL — ABNORMAL HIGH (ref 0–99)
Triglycerides: 148 mg/dL (ref 0–149)
VLDL Cholesterol Cal: 27 mg/dL (ref 5–40)

## 2021-10-12 ENCOUNTER — Ambulatory Visit: Payer: Medicaid Other

## 2021-11-02 ENCOUNTER — Ambulatory Visit
Admission: RE | Admit: 2021-11-02 | Discharge: 2021-11-02 | Disposition: A | Discharge status: Home / Self Care | Payer: Medicaid Other | Source: Ambulatory Visit | Attending: Family Medicine | Admitting: Family Medicine

## 2021-11-02 DIAGNOSIS — Z1231 Encounter for screening mammogram for malignant neoplasm of breast: Secondary | ICD-10-CM

## 2022-01-24 ENCOUNTER — Other Ambulatory Visit: Payer: Self-pay

## 2022-01-24 ENCOUNTER — Encounter (HOSPITAL_BASED_OUTPATIENT_CLINIC_OR_DEPARTMENT_OTHER): Payer: Self-pay | Admitting: Surgery

## 2022-01-30 NOTE — Progress Notes (Signed)
Sent e-mail to interpreting services canceling need for interpreter 01/31/22 and requested a time and day change to 02/19/22 with arrival time of 0930. Total time interpreter needed would be 0930-1300 .

## 2022-02-12 NOTE — Progress Notes (Signed)
Sent follow up email with [facility] Interpreters to confirm the surgery date and time change from previously schedule date and time.

## 2022-02-14 NOTE — Progress Notes (Signed)
Son aware of arrival time for patient. Dari interpreter requested by Joaquim Lai RN

## 2022-02-15 ENCOUNTER — Ambulatory Visit: Payer: Self-pay | Admitting: Surgery

## 2022-02-15 ENCOUNTER — Other Ambulatory Visit: Payer: Self-pay | Admitting: Surgery

## 2022-02-18 NOTE — Progress Notes (Signed)

## 2022-02-19 ENCOUNTER — Encounter (HOSPITAL_BASED_OUTPATIENT_CLINIC_OR_DEPARTMENT_OTHER)
Admission: RE | Disposition: A | Discharge status: Home / Self Care | Payer: Self-pay | Source: Home / Self Care | Attending: Surgery

## 2022-02-19 ENCOUNTER — Ambulatory Visit (HOSPITAL_BASED_OUTPATIENT_CLINIC_OR_DEPARTMENT_OTHER): Payer: Medicaid Other | Admitting: Certified Registered"

## 2022-02-19 ENCOUNTER — Encounter (HOSPITAL_BASED_OUTPATIENT_CLINIC_OR_DEPARTMENT_OTHER): Payer: Self-pay | Admitting: Surgery

## 2022-02-19 ENCOUNTER — Other Ambulatory Visit: Payer: Self-pay

## 2022-02-19 ENCOUNTER — Ambulatory Visit (HOSPITAL_COMMUNITY)
Admission: RE | Admit: 2022-02-19 | Discharge: 2022-02-19 | Disposition: A | Discharge status: Home / Self Care | Payer: Medicaid Other | Attending: Surgery | Admitting: Surgery

## 2022-02-19 DIAGNOSIS — E669 Obesity, unspecified: Secondary | ICD-10-CM | POA: Insufficient documentation

## 2022-02-19 DIAGNOSIS — K219 Gastro-esophageal reflux disease without esophagitis: Secondary | ICD-10-CM | POA: Diagnosis not present

## 2022-02-19 DIAGNOSIS — D171 Benign lipomatous neoplasm of skin and subcutaneous tissue of trunk: Secondary | ICD-10-CM | POA: Insufficient documentation

## 2022-02-19 DIAGNOSIS — M1990 Unspecified osteoarthritis, unspecified site: Secondary | ICD-10-CM | POA: Insufficient documentation

## 2022-02-19 DIAGNOSIS — Z6833 Body mass index (BMI) 33.0-33.9, adult: Secondary | ICD-10-CM | POA: Diagnosis not present

## 2022-02-19 DIAGNOSIS — Z79899 Other long term (current) drug therapy: Secondary | ICD-10-CM | POA: Insufficient documentation

## 2022-02-19 HISTORY — DX: Pure hypercholesterolemia, unspecified: E78.00

## 2022-02-19 HISTORY — PX: LIPOMA EXCISION: SHX5283

## 2022-02-19 SURGERY — EXCISION LIPOMA
Anesthesia: General | Site: Back | Laterality: Left

## 2022-02-19 MED ORDER — OXYCODONE HCL 5 MG PO TABS: 5.0000 mg | ORAL_TABLET | Freq: Once | ORAL | Status: DC | PRN

## 2022-02-19 MED ORDER — ACETAMINOPHEN 500 MG PO TABS
1000.0000 mg | ORAL_TABLET | ORAL | Status: AC
  Administered 2022-02-19: 1000 mg via ORAL

## 2022-02-19 MED ORDER — DEXAMETHASONE SODIUM PHOSPHATE 4 MG/ML IJ SOLN
INTRAMUSCULAR | Status: DC | PRN
  Administered 2022-02-19: 4 mg via INTRAVENOUS

## 2022-02-19 MED ORDER — PHENYLEPHRINE HCL (PRESSORS) 10 MG/ML IV SOLN
INTRAVENOUS | Status: DC | PRN
  Administered 2022-02-19: 40 ug via INTRAVENOUS

## 2022-02-19 MED ORDER — PROPOFOL 10 MG/ML IV BOLUS
INTRAVENOUS | Status: DC | PRN
  Administered 2022-02-19: 150 mg via INTRAVENOUS

## 2022-02-19 MED ORDER — IBUPROFEN 800 MG PO TABS: 800.0000 mg | ORAL_TABLET | Freq: Three times a day (TID) | ORAL | 0 refills | Status: DC | PRN

## 2022-02-19 MED ORDER — CHLORHEXIDINE GLUCONATE CLOTH 2 % EX PADS: 6.0000 | MEDICATED_PAD | Freq: Once | CUTANEOUS | Status: DC

## 2022-02-19 MED ORDER — OXYCODONE HCL 5 MG PO TABS: 5.0000 mg | ORAL_TABLET | Freq: Four times a day (QID) | ORAL | 0 refills | Status: DC | PRN

## 2022-02-19 MED ORDER — LIDOCAINE HCL (CARDIAC) PF 100 MG/5ML IV SOSY
PREFILLED_SYRINGE | INTRAVENOUS | Status: DC | PRN
  Administered 2022-02-19: 60 mg via INTRAVENOUS

## 2022-02-19 MED ORDER — CEFAZOLIN SODIUM-DEXTROSE 2-4 GM/100ML-% IV SOLN
2.0000 g | INTRAVENOUS | Status: AC
  Administered 2022-02-19: 2 g via INTRAVENOUS

## 2022-02-19 MED ORDER — CEFAZOLIN SODIUM-DEXTROSE 2-4 GM/100ML-% IV SOLN
INTRAVENOUS | Status: AC
  Filled 2022-02-19: qty 100

## 2022-02-19 MED ORDER — FENTANYL CITRATE (PF) 100 MCG/2ML IJ SOLN
INTRAMUSCULAR | Status: AC
  Filled 2022-02-19: qty 2

## 2022-02-19 MED ORDER — GABAPENTIN 300 MG PO CAPS
ORAL_CAPSULE | ORAL | Status: AC
  Filled 2022-02-19: qty 1

## 2022-02-19 MED ORDER — FENTANYL CITRATE (PF) 100 MCG/2ML IJ SOLN: 25.0000 ug | INTRAMUSCULAR | Status: DC | PRN

## 2022-02-19 MED ORDER — BUPIVACAINE-EPINEPHRINE 0.25% -1:200000 IJ SOLN
INTRAMUSCULAR | Status: DC | PRN
  Administered 2022-02-19: 10 mL

## 2022-02-19 MED ORDER — ACETAMINOPHEN 500 MG PO TABS
ORAL_TABLET | ORAL | Status: AC
  Filled 2022-02-19: qty 2

## 2022-02-19 MED ORDER — MIDAZOLAM HCL 2 MG/2ML IJ SOLN
INTRAMUSCULAR | Status: AC
  Filled 2022-02-19: qty 2

## 2022-02-19 MED ORDER — 0.9 % SODIUM CHLORIDE (POUR BTL) OPTIME
TOPICAL | Status: DC | PRN
  Administered 2022-02-19: 200 mL

## 2022-02-19 MED ORDER — GABAPENTIN 300 MG PO CAPS
300.0000 mg | ORAL_CAPSULE | ORAL | Status: AC
  Administered 2022-02-19: 300 mg via ORAL

## 2022-02-19 MED ORDER — ONDANSETRON HCL 4 MG/2ML IJ SOLN: 4.0000 mg | Freq: Once | INTRAMUSCULAR | Status: DC | PRN

## 2022-02-19 MED ORDER — FENTANYL CITRATE (PF) 100 MCG/2ML IJ SOLN
INTRAMUSCULAR | Status: DC | PRN
  Administered 2022-02-19: 50 ug via INTRAVENOUS
  Administered 2022-02-19: 25 ug via INTRAVENOUS

## 2022-02-19 MED ORDER — OXYCODONE HCL 5 MG/5ML PO SOLN: 5.0000 mg | Freq: Once | ORAL | Status: DC | PRN

## 2022-02-19 MED ORDER — LACTATED RINGERS IV SOLN: INTRAVENOUS | Status: DC

## 2022-02-19 MED ORDER — MIDAZOLAM HCL 5 MG/5ML IJ SOLN
INTRAMUSCULAR | Status: DC | PRN
  Administered 2022-02-19: 1 mg via INTRAVENOUS

## 2022-02-19 MED ORDER — PROPOFOL 500 MG/50ML IV EMUL
INTRAVENOUS | Status: DC | PRN
  Administered 2022-02-19: 25 ug/kg/min via INTRAVENOUS

## 2022-02-19 MED ORDER — ONDANSETRON HCL 4 MG/2ML IJ SOLN
INTRAMUSCULAR | Status: DC | PRN
  Administered 2022-02-19: 4 mg via INTRAVENOUS

## 2022-02-19 SURGICAL SUPPLY — 47 items
ADH SKN CLS APL DERMABOND .7 (GAUZE/BANDAGES/DRESSINGS) ×1
APL PRP STRL LF DISP 70% ISPRP (MISCELLANEOUS) ×1
APL SKNCLS STERI-STRIP NONHPOA (GAUZE/BANDAGES/DRESSINGS)
BENZOIN TINCTURE PRP APPL 2/3 (GAUZE/BANDAGES/DRESSINGS) IMPLANT
BLADE SURG 10 STRL SS (BLADE) IMPLANT
BLADE SURG 15 STRL LF DISP TIS (BLADE) ×1 IMPLANT
BLADE SURG 15 STRL SS (BLADE) ×2
CANISTER SUCT 1200ML W/VALVE (MISCELLANEOUS) ×1 IMPLANT
CHLORAPREP W/TINT 26 (MISCELLANEOUS) ×2 IMPLANT
COVER BACK TABLE 60X90IN (DRAPES) ×2 IMPLANT
COVER MAYO STAND STRL (DRAPES) ×2 IMPLANT
DERMABOND ADVANCED (GAUZE/BANDAGES/DRESSINGS) ×1
DERMABOND ADVANCED .7 DNX12 (GAUZE/BANDAGES/DRESSINGS) IMPLANT
DRAPE LAPAROTOMY 100X72 PEDS (DRAPES) ×2 IMPLANT
DRAPE UTILITY XL STRL (DRAPES) ×2 IMPLANT
ELECT COATED BLADE 2.86 ST (ELECTRODE) ×2 IMPLANT
ELECT REM PT RETURN 9FT ADLT (ELECTROSURGICAL) ×2
ELECTRODE REM PT RTRN 9FT ADLT (ELECTROSURGICAL) ×1 IMPLANT
GLOVE BIOGEL PI IND STRL 6.5 (GLOVE) IMPLANT
GLOVE BIOGEL PI IND STRL 7.0 (GLOVE) IMPLANT
GLOVE BIOGEL PI IND STRL 8 (GLOVE) ×1 IMPLANT
GLOVE BIOGEL PI INDICATOR 6.5 (GLOVE) ×1
GLOVE BIOGEL PI INDICATOR 7.0 (GLOVE) ×2
GLOVE BIOGEL PI INDICATOR 8 (GLOVE) ×1
GLOVE ECLIPSE 8.0 STRL XLNG CF (GLOVE) ×2 IMPLANT
GLOVE SURG SS PI 6.5 STRL IVOR (GLOVE) ×1 IMPLANT
GOWN STRL REUS W/ TWL LRG LVL3 (GOWN DISPOSABLE) ×2 IMPLANT
GOWN STRL REUS W/ TWL XL LVL3 (GOWN DISPOSABLE) ×1 IMPLANT
GOWN STRL REUS W/TWL LRG LVL3 (GOWN DISPOSABLE) ×6
GOWN STRL REUS W/TWL XL LVL3 (GOWN DISPOSABLE)
NDL HYPO 25X1 1.5 SAFETY (NEEDLE) ×1 IMPLANT
NEEDLE HYPO 25X1 1.5 SAFETY (NEEDLE) ×2 IMPLANT
NS IRRIG 1000ML POUR BTL (IV SOLUTION) ×1 IMPLANT
PACK BASIN DAY SURGERY FS (CUSTOM PROCEDURE TRAY) ×2 IMPLANT
PENCIL SMOKE EVACUATOR (MISCELLANEOUS) ×2 IMPLANT
SLEEVE SCD COMPRESS KNEE MED (STOCKING) ×2 IMPLANT
SPIKE FLUID TRANSFER (MISCELLANEOUS) IMPLANT
SPONGE T-LAP 4X18 ~~LOC~~+RFID (SPONGE) ×2 IMPLANT
STAPLER VISISTAT 35W (STAPLE) IMPLANT
STRIP CLOSURE SKIN 1/2X4 (GAUZE/BANDAGES/DRESSINGS) IMPLANT
SUT MON AB 4-0 PC3 18 (SUTURE) ×2 IMPLANT
SUT VICRYL 3-0 CR8 SH (SUTURE) ×1 IMPLANT
SUT VICRYL AB 3 0 TIES (SUTURE) IMPLANT
SYR CONTROL 10ML LL (SYRINGE) ×2 IMPLANT
TOWEL GREEN STERILE FF (TOWEL DISPOSABLE) ×3 IMPLANT
TUBE CONNECTING 20X1/4 (TUBING) ×1 IMPLANT
YANKAUER SUCT BULB TIP NO VENT (SUCTIONS) ×1 IMPLANT

## 2022-02-19 NOTE — Anesthesia Postprocedure Evaluation (Signed)
Anesthesia Post Note  Patient: Nichole Baker  Procedure(s) Performed: EXCISION LEFT BACK  LIPOMA (Left: Back)     Patient location during evaluation: PACU Anesthesia Type: General Level of consciousness: awake and alert and oriented Pain management: pain level controlled Vital Signs Assessment: post-procedure vital signs reviewed and stable Respiratory status: spontaneous breathing, nonlabored ventilation and respiratory function stable Cardiovascular status: blood pressure returned to baseline and stable Postop Assessment: no apparent nausea or vomiting Anesthetic complications: no   No notable events documented.  Last Vitals:  Vitals:   02/19/22 0818 02/19/22 1015  BP: (!) 141/92   Pulse: 87   Resp: 20   Temp: 36.8 C 36.5 C  SpO2: 99% 98%    Last Pain:  Vitals:   02/19/22 1015  TempSrc:   PainSc: Asleep                 Laquasia Pincus A.

## 2022-02-19 NOTE — Interval H&P Note (Signed)
History and Physical Interval Note:  02/19/2022 9:06 AM  Nichole Baker  has presented today for surgery, with the diagnosis of LIPOMA.  The various methods of treatment have been discussed with the patient and family. After consideration of risks, benefits and other options for treatment, the patient has consented to  Procedure(s): EXCISION LEFT BACK  LIPOMA (Left) as a surgical intervention.  The patient's history has been reviewed, patient examined, no change in status, stable for surgery.  I have reviewed the patient's chart and labs.  Questions were answered to the patient's satisfaction.     [place]

## 2022-02-19 NOTE — Anesthesia Procedure Notes (Signed)
Procedure Name: LMA Insertion Date/Time: 02/19/2022 9:33 AM  Performed by: Raif Chachere, Ernesta Amble, CRNAPre-anesthesia Checklist: Patient identified, Emergency Drugs available, Suction available and Patient being monitored Patient Re-evaluated:Patient Re-evaluated prior to induction Oxygen Delivery Method: Circle system utilized Preoxygenation: Pre-oxygenation with 100% oxygen Induction Type: IV induction Ventilation: Mask ventilation without difficulty LMA: LMA inserted LMA Size: 4.0 Number of attempts: 1 Airway Equipment and Method: Bite block Placement Confirmation: positive ETCO2 Tube secured with: Tape Dental Injury: Teeth and Oropharynx as per pre-operative assessment

## 2022-02-19 NOTE — Transfer of Care (Signed)
Immediate Anesthesia Transfer of Care Note  Patient: Nichole Baker  Procedure(s) Performed: EXCISION LEFT BACK  LIPOMA (Left: Back)  Patient Location: PACU  Anesthesia Type:General  Level of Consciousness: drowsy and patient cooperative  Airway & Oxygen Therapy: Patient Spontanous Breathing and Patient connected to face mask oxygen  Post-op Assessment: Report given to RN and Post -op Vital signs reviewed and stable  Post vital signs: Reviewed and stable  Last Vitals:  Vitals Value Taken Time  BP 117/71 02/19/22 1016  Temp    Pulse 68 02/19/22 1017  Resp    SpO2 97 % 02/19/22 1017  Vitals shown include unvalidated device data.  Last Pain:  Vitals:   02/19/22 0818  TempSrc: Oral  PainSc: 5          Complications: No notable events documented.

## 2022-02-19 NOTE — H&P (Signed)
History of Present Illness: Nichole Baker is a 61 y.o. female who is seen today as an office consultation for evaluation of No chief complaint on file. .   Patient presents for evaluation of left back mass. Its been present for a year. It is growing slowly. This felt to be a lipoma by her primary care doctor and she is here today to discuss this. A language translating line was used for all communication. She has no pain, redness or drainage.  Review of Systems: A complete review of systems was obtained from the patient. I have reviewed this information and discussed as appropriate with the patient. See HPI as well for other ROS.    Medical History: History reviewed. No pertinent past medical history.  There is no problem list on file for this patient.  History reviewed. No pertinent surgical history.   No Known Allergies  No current outpatient medications on file prior to visit.   No current facility-administered medications on file prior to visit.   History reviewed. No pertinent family history.   Social History   Tobacco Use  Smoking Status Never  Smokeless Tobacco Never    Social History   Socioeconomic History  Marital status: Married  Tobacco Use  Smoking status: Never  Smokeless tobacco: Never  Substance and Sexual Activity  Alcohol use: Never  Drug use: Never   Objective:   Vitals:  12/25/21 1515  BP: 128/74  Pulse: 74  Temp: 36.8 C (98.2 F)  SpO2: 98%  Weight: 74.4 kg (164 lb)  Height: 149.9 cm ('4\' 11"'$ )   Body mass index is 33.12 kg/m.  Physical Exam HENT:  Head: Normocephalic.  Eyes:  Pupils: Pupils are equal, round, and reactive to light.  Cardiovascular:  Rate and Rhythm: Normal rate.  Pulmonary:  Effort: Pulmonary effort is normal.  Musculoskeletal:  General: Normal range of motion.  Cervical back: Normal range of motion.  Skin: General: Skin is warm.    Comments: 10 cm x 15 cm mass mobile left central back  Neurological:   General: No focal deficit present.  Mental Status: She is alert.  Psychiatric:  Mood and Affect: Mood normal.    Assessment and Plan:   Diagnoses and all orders for this visit:  Lipoma of back    Patient noted to have lipoma left upper back. This measures 10 x 15 cm mobile and subcutaneous. The patient desires this to be removed. The natural history of lipoma discussed as well as potential long-term expectations of surgery versus observation. Risks and benefits of surgery reviewed as well as complications of surgery to include but not occlusive of bleeding, infection, recurrence, cosmetic deformity, wound complications, and injury to neighboring structures. She has agreed to proceed with surgery. Language line used for all interpretation.  No follow-ups on file.  Kennieth Francois, MD

## 2022-02-19 NOTE — Op Note (Signed)
Preoperative diagnosis: Left central back lipoma measuring 10 x 15 cm subcutaneous  Postoperative diagnosis: Same  Procedure: Excision of left back lipoma  Surgeon: Erroll Luna, MD  Assistant: Dr. Dimas Millin MD  I was personally present during the key and critical portions of this procedure and immediately available throughout the entire procedure, as documented in my operative note.   Anesthesia: General with 0.25% Marcaine with epinephrine  EBL: Minimal  Specimen: Lipoma  Drains: None  IV fluids: Per anesthesia record  Indications for procedure: The patient is a 61 year old female with a left upper back lipoma.  She presents today for excision.  She was seen in consultation and with the assistance of a translator we will discuss what a lipoma was, pathophysiology, natural history and surgical removal versus observation.  After discussion of the above she agreed to proceed. The procedure has been discussed with the patient.  Alternative therapies have been discussed with the patient.  Operative risks include bleeding,  Infection,  Organ injury,  Nerve injury,  Blood vessel injury,  DVT,  Pulmonary embolism,  Death,  And possible reoperation.  Medical management risks include worsening of present situation.  The success of the procedure is 50 -90 % at treating patients symptoms.  The patient understands and agrees to proceed.      Description of procedure: The patient presents to the operating room.  The left mass was marked in the holding area and she was placed upon initially on the OR table.  After induction of anesthesia she was rolled up onto her right side to expose her left side and padded appropriately.  The area was well marked.  He was then prepped and draped in sterile fashion after induction of general esthesia and a timeout was performed.  A transverse incision was made over the mass.  Dissection was carried down and the fatty mass was encountered appeared to be a well  encapsulated lipoma measuring 10 x 15 cm.  The area is excised in a piecemeal fashion.  The entire capsule was removed.  Hemostasis was achieved.  Cautery was used for that.  Irrigation was used.  The deep layer of tissue was closed with 3-0 Vicryl.  4 Monocryl was used to close the skin in a subcuticular fashion.  Dermabond applied.  All counts found to be correct.  The patient was awoke extubated taken to recovery in satisfactory condition.

## 2022-02-19 NOTE — Anesthesia Preprocedure Evaluation (Signed)
Anesthesia Evaluation  Patient identified by MRN, date of birth, ID band Patient awake    Reviewed: Allergy & Precautions, NPO status , Patient's Chart, lab work & pertinent test results  Airway Mallampati: II  TM Distance: >3 FB Neck ROM: Full    Dental no notable dental hx. (+) Teeth Intact, Dental Advisory Given   Pulmonary neg pulmonary ROS,    Pulmonary exam normal breath sounds clear to auscultation       Cardiovascular negative cardio ROS Normal cardiovascular exam Rhythm:Regular Rate:Normal     Neuro/Psych negative neurological ROS  negative psych ROS   GI/Hepatic Neg liver ROS, GERD  Medicated,  Endo/Other  Obesity  Renal/GU negative Renal ROS  negative genitourinary   Musculoskeletal  (+) Arthritis , Osteoarthritis,  Left back lipoma   Abdominal (+) + obese,   Peds  Hematology negative hematology ROS (+)   Anesthesia Other Findings   Reproductive/Obstetrics                             Anesthesia Physical Anesthesia Plan  ASA: 2  Anesthesia Plan: General   Post-op Pain Management: Minimal or no pain anticipated   Induction: Intravenous  PONV Risk Score and Plan: 4 or greater and Treatment may vary due to age or medical condition, Midazolam, Ondansetron and Dexamethasone  Airway Management Planned: LMA and Oral ETT  Additional Equipment: None  Intra-op Plan:   Post-operative Plan: Extubation in OR  Informed Consent: I have reviewed the patients History and Physical, chart, labs and discussed the procedure including the risks, benefits and alternatives for the proposed anesthesia with the patient or authorized representative who has indicated his/her understanding and acceptance.     Dental advisory given  Plan Discussed with: CRNA and Anesthesiologist  Anesthesia Plan Comments:         Anesthesia Quick Evaluation

## 2022-02-19 NOTE — Discharge Instructions (Addendum)
Tylenol given at 0900 am- do not take until 3 p.m.  #######################################################  GENERAL SURGERY: POST OP INSTRUCTIONS  ######################################################################  EAT Gradually transition to a high fiber diet with a fiber supplement over the next few weeks after discharge.  Start with a pureed / full liquid diet (see below)  WALK Walk an hour a day.  Control your pain to do that.    CONTROL PAIN Control pain so that you can walk, sleep, tolerate sneezing/coughing, go up/down stairs.  HAVE A BOWEL MOVEMENT DAILY Keep your bowels regular to avoid problems.  OK to try a laxative to override constipation.  OK to use an antidairrheal to slow down diarrhea.  Call if not better after 2 tries  CALL IF YOU HAVE PROBLEMS/CONCERNS Call if you are still struggling despite following these instructions. Call if you have concerns not answered by these instructions  ######################################################################    DIET: Follow a light bland diet & liquids the first 24 hours after arrival home, such as soup, liquids, starches, etc.  Be sure to drink plenty of fluids.  Quickly advance to a usual solid diet within a few days.  Avoid fast food or heavy meals as your are more likely to get nauseated or have irregular bowels.  A low-fat, high-fiber diet for the rest of your life is ideal.    Take your usually prescribed home medications unless otherwise directed.  PAIN CONTROL: Pain is best controlled by a usual combination of three different methods TOGETHER: Ice/Heat Over the counter pain medication Prescription pain medication Most patients will experience some swelling and bruising around the incisions.  Ice packs or heating pads (30-60 minutes up to 6 times a day) will help. Use ice for the first few days to help decrease swelling and bruising, then switch to heat to help relax tight/sore spots and speed recovery.   Some people prefer to use ice alone, heat alone, alternating between ice & heat.  Experiment to what works for you.  Swelling and bruising can take several weeks to resolve.   It is helpful to take an over-the-counter pain medication regularly for the first few weeks.  Choose one of the following that works best for you: Naproxen (Aleve, etc)  Two '220mg'$  tabs twice a day Ibuprofen (Advil, etc) Three '200mg'$  tabs four times a day (every meal & bedtime) Acetaminophen (Tylenol, etc) 500-'650mg'$  four times a day (every meal & bedtime) A  prescription for pain medication (such as oxycodone, hydrocodone, etc) should be given to you upon discharge.  Take your pain medication as prescribed.  If you are having problems/concerns with the prescription medicine (does not control pain, nausea, vomiting, rash, itching, etc), please call us [phone] to see if we need to switch you to a different pain medicine that will work better for you and/or control your side effect better. If you need a refill on your pain medication, please contact your pharmacy.  They will contact our office to request authorization. Prescriptions will not be filled after 5 pm or on week-ends.  Avoid getting constipated.  Between the surgery and the pain medications, it is common to experience some constipation.  Increasing fluid intake and taking a fiber supplement (such as Metamucil, Citrucel, FiberCon, MiraLax, etc) 1-2 times a day regularly will usually help prevent this problem from occurring.  A mild laxative (prune juice, Milk of Magnesia, MiraLax, etc) should be taken according to package directions if there are no bowel movements after 48 hours.   Watch out  for diarrhea.  If you have many loose bowel movements, simplify your diet to bland foods & liquids for a few days.  Stop any stool softeners and decrease your fiber supplement.  Switching to mild anti-diarrheal medications (Loperamide/Imodium, Kayopectate, Pepto Bismol) can help.  If  this worsens or does not improve, please call us.  Wash / shower every day.  You may shower over the dressings as they are waterproof.  Continue to shower over incision(s) after the dressing is off. Remove your waterproof bandages 5 days after surgery.  You may leave the incision open to air.  You may have skin tapes (Steri Strips) covering the incision(s).  Leave them on until one week, then remove.  You may replace a dressing/Band-Aid to cover the incision for comfort if you wish.   ACTIVITIES as tolerated:   You may resume regular (light) daily activities beginning the next day--such as daily self-care, walking, climbing stairs--gradually increasing activities as tolerated.  If you can walk 30 minutes without difficulty, it is safe to try more intense activity such as jogging, treadmill, bicycling, low-impact aerobics, swimming, etc. Save the most intensive and strenuous activity for last such as sit-ups, heavy lifting, contact sports, etc  Refrain from any heavy lifting or straining until you are off narcotics for pain control.   DO NOT PUSH THROUGH PAIN.  Let pain be your guide: If it hurts to do something, don't do it.  Pain is your body warning you to avoid that activity for another week until the pain goes down. You may drive when you are no longer taking prescription pain medication, you can comfortably wear a seatbelt, and you can safely maneuver your car and apply brakes. You may have sexual intercourse when it is comfortable.   FOLLOW UP in our office Please call [facility] at [phone] to set up an appointment to see your surgeon in the office for a follow-up appointment approximately 2-3 weeks after your surgery. Make sure that you call for this appointment the day you arrive home to insure a convenient appointment time.  9. IF YOU HAVE DISABILITY OR FAMILY LEAVE FORMS, BRING THEM TO THE OFFICE FOR PROCESSING.  DO NOT GIVE THEM TO YOUR DOCTOR.   WHEN TO CALL us [phone]: Poor  pain control Reactions / problems with new medications (rash/itching, nausea, etc)  Fever over 101.5 F (38.5 C) Worsening swelling or bruising Continued bleeding from incision. Increased pain, redness, or drainage from the incision Difficulty breathing / swallowing   The clinic staff is available to answer your questions during regular business hours (8:30am-5pm).  Please don't hesitate to call and ask to speak to one of our nurses for clinical concerns.   If you have a medical emergency, go to the nearest emergency room or call 911.  A surgeon from [facility] is always on call at the [facility], [place], [place], [place], [place]  [id number] ? MAIN: [phone] ? TOLL FREE: [phone] ?  FAX [phone] [email or web address]  #######################################################   Post Anesthesia Home Care Instructions  Activity: Get plenty of rest for the remainder of the day. A responsible individual must stay with you for 24 hours following the procedure.  For the next 24 hours, DO NOT: -Drive a car -Paediatric nurse -Drink alcoholic beverages -Take any medication unless instructed by your physician -Make any legal decisions or sign important papers.  Meals: Start with liquid foods such as gelatin or soup.  Progress to regular foods as tolerated. Avoid greasy, spicy, heavy foods. If nausea and/or vomiting occur, drink only clear liquids until the nausea and/or vomiting subsides. Call your physician if vomiting continues.  Special Instructions/Symptoms: Your throat may feel dry or sore from the anesthesia or the breathing tube placed in your throat during surgery. If this causes discomfort, gargle with warm salt water. The discomfort should disappear within 24 hours.  If you had a scopolamine patch placed behind your ear for the management of post- operative nausea and/or vomiting:  1. The  medication in the patch is effective for 72 hours, after which it should be removed.  Wrap patch in a tissue and discard in the trash. Wash hands thoroughly with soap and water. 2. You may remove the patch earlier than 72 hours if you experience unpleasant side effects which may include dry mouth, dizziness or visual disturbances. 3. Avoid touching the patch. Wash your hands with soap and water after contact with the patch.

## 2022-02-19 NOTE — Progress Notes (Signed)
[employer] interpreting used- Dari interpreter [place] assisting.ID # [id number].

## 2022-02-20 ENCOUNTER — Encounter (HOSPITAL_BASED_OUTPATIENT_CLINIC_OR_DEPARTMENT_OTHER): Payer: Self-pay | Admitting: Surgery

## 2022-02-20 LAB — SURGICAL PATHOLOGY

## 2022-02-20 NOTE — Progress Notes (Signed)
No answer

## 2022-02-25 ENCOUNTER — Encounter: Payer: Self-pay | Admitting: Surgery

## 2022-03-19 ENCOUNTER — Encounter: Payer: Self-pay | Admitting: Family Medicine

## 2022-03-19 ENCOUNTER — Ambulatory Visit (INDEPENDENT_AMBULATORY_CARE_PROVIDER_SITE_OTHER): Payer: Medicaid Other | Admitting: Family Medicine

## 2022-03-19 VITALS — BP 129/86 | HR 78 | Ht 60.0 in | Wt 160.4 lb

## 2022-03-19 DIAGNOSIS — R7303 Prediabetes: Secondary | ICD-10-CM | POA: Diagnosis present

## 2022-03-19 DIAGNOSIS — K649 Unspecified hemorrhoids: Secondary | ICD-10-CM

## 2022-03-19 NOTE — Assessment & Plan Note (Addendum)
Stable. - Continue Preparation H and bowel regimen management

## 2022-03-19 NOTE — Patient Instructions (Addendum)
   You can take these medications as needed for constipation.     For your pain, you can take Tylenol or Ibuprofen every 6 hours as needed.

## 2022-03-19 NOTE — Progress Notes (Signed)
    SUBJECTIVE:   CHIEF COMPLAINT / HPI:   Dari interpreter present during entire visit .  Constipation - Patient reports improved constipation with Dulcolax - Has stopped pain medications given after her surgery due to constipation - Previously was seeing in orange tinge to her stools but that has improved - Also feels that she may have a hemorrhoid  PERTINENT  PMH / PSH: Reviewed  OBJECTIVE:   BP 129/86   Pulse 78   Ht 5' (1.524 m)   Wt 160 lb 6.4 oz (72.8 kg)   SpO2 100%   BMI 31.33 kg/m   General: NAD, well-appearing, well-nourished Respiratory: No respiratory distress, breathing comfortably, able to speak in full sentences Skin: warm and dry, well-healing scar on left lower back Psych: Appropriate affect and mood  ASSESSMENT/PLAN:   Hemorrhoids Stable. - Continue Preparation H and bowel regimen management   Constipation Most recently partially in an days to pain medications including oxycodone, with patient is no longer taking.  Constipation has resolved with Dulcolax. - Continue Dulcolax as needed - Can switch to Metamucil or MiraLAX if patient prefers  Rise Patience, [place]

## 2022-03-20 LAB — HEMOGLOBIN A1C
Est. average glucose Bld gHb Est-mCnc: 128 mg/dL
Hgb A1c MFr Bld: 6.1 % — ABNORMAL HIGH (ref 4.8–5.6)

## 2022-03-21 ENCOUNTER — Other Ambulatory Visit: Payer: Self-pay | Admitting: Surgery

## 2022-03-21 MED ORDER — IBUPROFEN 800 MG PO TABS: 800.0000 mg | ORAL_TABLET | Freq: Three times a day (TID) | ORAL | 0 refills | Status: DC | PRN

## 2022-04-19 ENCOUNTER — Ambulatory Visit (INDEPENDENT_AMBULATORY_CARE_PROVIDER_SITE_OTHER): Payer: Medicaid Other | Admitting: Family Medicine

## 2022-04-19 ENCOUNTER — Encounter: Payer: Self-pay | Admitting: Gastroenterology

## 2022-04-19 ENCOUNTER — Encounter: Payer: Self-pay | Admitting: Family Medicine

## 2022-04-19 VITALS — BP 119/63 | HR 72 | Ht 60.0 in | Wt 155.8 lb

## 2022-04-19 DIAGNOSIS — Z23 Encounter for immunization: Secondary | ICD-10-CM

## 2022-04-19 DIAGNOSIS — Z1211 Encounter for screening for malignant neoplasm of colon: Secondary | ICD-10-CM | POA: Diagnosis present

## 2022-04-19 DIAGNOSIS — R7303 Prediabetes: Secondary | ICD-10-CM | POA: Diagnosis not present

## 2022-04-19 NOTE — Assessment & Plan Note (Signed)
Called and scheduled appt today with [facility] for colonoscopy

## 2022-04-19 NOTE — Progress Notes (Signed)
    SUBJECTIVE:   CHIEF COMPLAINT / HPI:   Follow up prediabetes- She has lost 5 lbs since last visit. She walks for an hour every day. Continue to limit carbohydrates. Wondering when it needs to be rechecked.  HCM - Would like flu vaccine - Due for repeat colonoscopy- saw [facility] with Dr Candis Schatz 03/2021 and recommended 1 year repeat  PERTINENT  PMH / PSH: prediabetes, elevated LDL, lipoma of back s/p recent removal  OBJECTIVE:   BP 119/63   Pulse 72   Ht 5' (1.524 m)   Wt 155 lb 12.8 oz (70.7 kg)   SpO2 100%   BMI 30.43 kg/m   General: alert & oriented, no apparent distress, well groomed HEENT: normocephalic, atraumatic, EOM grossly intact, oral mucosa moist, neck supple Respiratory: normal respiratory effort GI: non-distended Skin: no rashes, no jaundice Psych: appropriate mood and affect   ASSESSMENT/PLAN:   Screen for colon cancer Called and scheduled appt today with [facility] for colonoscopy  Prediabetes Congratulated patient on weight loss, continue diet and exercise changes, repeat A1c in 5 months    Influenza vaccine given today.  Lenoria Chime, MD [place]

## 2022-04-19 NOTE — Assessment & Plan Note (Signed)
Congratulated patient on weight loss, continue diet and exercise changes, repeat A1c in 5 months

## 2022-04-19 NOTE — Patient Instructions (Signed)
It was wonderful to see you today.  Please bring ALL of your medications with you to every visit.   Today we talked about:  We have scheduled your follow up colonoscopy  We gave your flu shot today   Thank you for choosing [facility].   Please call [phone] with any questions about today's appointment.  Please be sure to schedule follow up at the front  desk before you leave today.   Please arrive at least 15 minutes prior to your scheduled appointments.   If you had blood work today, I will send you a MyChart message or a letter if results are normal. Otherwise, I will give you a call.   If you had a referral placed, they will call you to set up an appointment. Please give [place] a call if you don't hear back in the next 2 weeks.   If you need additional refills before your next appointment, please call your pharmacy first.   Yehuda Savannah, MD  Family Medicine

## 2022-05-07 ENCOUNTER — Ambulatory Visit: Payer: Medicaid Other | Admitting: Family Medicine

## 2022-05-14 ENCOUNTER — Encounter: Payer: Medicaid Other | Admitting: Gastroenterology

## 2022-05-14 ENCOUNTER — Ambulatory Visit (INDEPENDENT_AMBULATORY_CARE_PROVIDER_SITE_OTHER): Payer: Medicaid Other | Admitting: Family Medicine

## 2022-05-14 ENCOUNTER — Ambulatory Visit: Payer: Medicaid Other

## 2022-05-14 ENCOUNTER — Encounter: Payer: Self-pay | Admitting: Family Medicine

## 2022-05-14 VITALS — BP 108/71 | HR 66 | Ht 60.0 in | Wt 154.2 lb

## 2022-05-14 DIAGNOSIS — R2242 Localized swelling, mass and lump, left lower limb: Secondary | ICD-10-CM | POA: Insufficient documentation

## 2022-05-14 NOTE — Progress Notes (Signed)
    SUBJECTIVE:   CHIEF COMPLAINT / HPI:   In person Dari interpreter present.  Nichole Baker is a 61 y.o. female who presents to the [facility] clinic today to discuss the following concerns:   Thigh Swelling States that for the last month she felt swelling to her left thigh. No pain when she feels feels it though she reports it was "hard" previously. She hasn't tried anything for this. No recent falls.   PERTINENT  PMH / PSH: Skin tag of ear, lipoma of torso, left breast mass  OBJECTIVE:   BP 108/71   Pulse 66   Ht 5' (1.524 m)   Wt 154 lb 3.2 oz (69.9 kg)   SpO2 100%   BMI 30.12 kg/m    General: NAD, pleasant, able to participate in exam, normal gait Respiratory: normal effort Skin: warm and dry Left Thigh: Approximately 9x11 cm soft and non-tender mass. No erythema or fluctuance  Psych: Normal affect and mood  ASSESSMENT/PLAN:   Mass of left thigh Large 9x11 cm soft but not very mobile mass to lateral left thigh. Patient reports it has been present for the last month. Was previously "hard" per her report. No erythema, warmth or fluctuance. Non-painful. Differential includes lipoma vs hematoma vs malignancy.  -U/S to left lower extremity -F/u in a couple weeks, consider further imaging with CT scan if U/S is non-reassuring     Sharion Settler, [place]

## 2022-05-14 NOTE — Patient Instructions (Addendum)
It was nice to see you today. I have ordered an ultrasound to further examine the mass to your thigh.    ?????? ??? ?? ????? ????? ?? ????? ???? ????????? ???? ????? ????? ???? ?? ??? ??? ??.

## 2022-05-14 NOTE — Assessment & Plan Note (Signed)
Large 9x11 cm soft but not very mobile mass to lateral left thigh. Patient reports it has been present for the last month. Was previously "hard" per her report. No erythema, warmth or fluctuance. Non-painful. Differential includes lipoma vs hematoma vs malignancy.  -U/S to left lower extremity -F/u in a couple weeks, consider further imaging with CT scan if U/S is non-reassuring

## 2022-05-14 NOTE — Progress Notes (Deleted)
  SUBJECTIVE:   CHIEF COMPLAINT / HPI:   ***  PERTINENT  PMH / PSH: ***  Past Medical History:  Diagnosis Date   Hypercholesterolemia     OBJECTIVE:  There were no vitals taken for this visit. Physical Exam   ASSESSMENT/PLAN:  There are no diagnoses linked to this encounter. No follow-ups on file. Wells Guiles, DO 05/14/2022, 8:05 AM PGY-***, [facility] {    This will disappear when note is signed, click to select method of visit    :1}

## 2022-05-20 ENCOUNTER — Ambulatory Visit
Admission: RE | Admit: 2022-05-20 | Discharge: 2022-05-20 | Disposition: A | Discharge status: Home / Self Care | Payer: Medicaid Other | Source: Ambulatory Visit | Attending: Family Medicine | Admitting: Family Medicine

## 2022-05-20 DIAGNOSIS — R2242 Localized swelling, mass and lump, left lower limb: Secondary | ICD-10-CM

## 2022-05-21 ENCOUNTER — Encounter: Payer: Medicaid Other | Admitting: Gastroenterology

## 2022-05-28 ENCOUNTER — Ambulatory Visit: Payer: Medicaid Other

## 2022-06-04 ENCOUNTER — Ambulatory Visit (AMBULATORY_SURGERY_CENTER): Payer: Self-pay | Admitting: *Deleted

## 2022-06-04 VITALS — Ht 60.0 in | Wt 155.0 lb

## 2022-06-04 DIAGNOSIS — Z86010 Personal history of colonic polyps: Secondary | ICD-10-CM

## 2022-06-04 MED ORDER — PLENVU 140 G PO SOLR: 1.0000 | ORAL | 0 refills | Status: AC

## 2022-06-04 NOTE — Progress Notes (Signed)
No egg or soy allergy known to patient  No issues known to pt with past sedation with any surgeries or procedures Patient denies ever being told they had issues or difficulty with intubation  No FH of Malignant Hyperthermia Pt is not on diet pills Pt is not on  home 02  Pt is not on blood thinners  Pt denies issues with constipation  Pt encouraged to use to use Singlecare or Goodrx to reduce cost  Interpreter used today at the [facility] for this pt.   Interpreter's name is- Mahin  Patient's chart reviewed by Osvaldo Angst CNRA prior to previsit and patient appropriate for the [facility].  Previsit completed and red dot placed by patient's name on their procedure day (on provider's schedule).  . In person

## 2022-06-11 ENCOUNTER — Ambulatory Visit (INDEPENDENT_AMBULATORY_CARE_PROVIDER_SITE_OTHER): Payer: Medicaid Other | Admitting: Family Medicine

## 2022-06-11 ENCOUNTER — Other Ambulatory Visit: Payer: Self-pay

## 2022-06-11 VITALS — BP 116/65 | HR 68 | Wt 154.8 lb

## 2022-06-11 DIAGNOSIS — Z23 Encounter for immunization: Secondary | ICD-10-CM | POA: Diagnosis not present

## 2022-06-11 DIAGNOSIS — R2242 Localized swelling, mass and lump, left lower limb: Secondary | ICD-10-CM | POA: Diagnosis present

## 2022-06-11 NOTE — Patient Instructions (Signed)
It was wonderful to see you today.  Please bring ALL of your medications with you to every visit.   Today we talked about:  --The spot on your leg appears to be benign tissue--we will monitor. Please return if this changes.  --Please follow up with Dr. Thompson Grayer  Please follow up in 1 months   Thank you for choosing [facility].   Please call [phone] with any questions about today's appointment.  Please be sure to schedule follow up at the front  desk before you leave today.   Dorris Singh, MD  Family Medicine

## 2022-06-11 NOTE — Assessment & Plan Note (Signed)
Mass on left thigh that's been present for 2 months, and burns sporadically when sitting/walking. Patient notes mass is not getting better, but has gotten softer over time. Patient had [place] of mass that was negative and didn't show anything. Mass less concerning for tumor/malignancy as it had no concerning findings on [place]. Mass could be lipoma, but is poorly circumscribed and large. Mass less likely cyst, given US findings and PE. At this time, I do not think this mass is something of concern, and we can continue to monitor. -Continue to monitor -F/u prn

## 2022-06-11 NOTE — Progress Notes (Signed)
  SUBJECTIVE:   CHIEF COMPLAINT / HPI:    Logen Sedlar is a pleasant 61 yo presenting today for follow up and discuss concerns. Only worry is follow up from her ultrasound.   F/u mass on thigh Has been there for 2 months, no pain but burning rated 0-5/10, burns with sitting and walking, and located on outside of leg. Used to be thicker/hard, now is softer, but hasn't been changing size. Also had one on the back, that was found in clinic 2 years ago, and removal was 4 months, doctor noted it was harmless.   PERTINENT  PMH / PSH:     OBJECTIVE:  BP 116/65   Pulse 68   Wt 154 lb 12.8 oz (70.2 kg)   SpO2 99%   BMI 30.23 kg/m  Cardiac: Warm well perfused.  Capillary refill less than 3 seconds Respiratory breathing comfortably on room air Psych: Pleasant normal affect, appropriate, normal rate of speech Ext: Excess soft tissue on L upper aspect of thigh, non-tender, no erythema, mobile and soft, no firmness    ASSESSMENT/PLAN:  Excess Tissue of L Thigh  Assessment & Plan: Mass on left thigh that's been present for 2 months, and burns sporadically when sitting/walking. Patient notes mass is not getting better, but has gotten softer over time. Patient had [place] of mass that was negative and didn't show anything. Mass less concerning for tumor/malignancy as it had no concerning findings on [place]. Mass could be lipoma, but is more consistent with changes in adipose and muscle with age.  Mass less likely cyst, given US findings and PE. At this time, I do not think this site  is something of concern, and we can continue to monitor. -Continue to monitor -Consider CT with contrast if changes, discussed with patient    Other orders -     Hepatitis A vaccine adult IM -     MMR vaccine subcutaneous  [facility] Patient inquiring about DEXA scan for Osteoporosis. Patient under age and has no risk factors for early screening. Patient will needs some reassurance at next visit.   Holley Bouche, MD    Dorris Singh, MD  Family Medicine Teaching Service

## 2022-06-25 ENCOUNTER — Encounter: Payer: Self-pay | Admitting: Gastroenterology

## 2022-06-25 ENCOUNTER — Ambulatory Visit (AMBULATORY_SURGERY_CENTER): Payer: Medicaid Other | Admitting: Gastroenterology

## 2022-06-25 VITALS — BP 128/70 | HR 81 | Temp 96.9°F | Resp 16 | Ht 60.0 in | Wt 155.0 lb

## 2022-06-25 DIAGNOSIS — D123 Benign neoplasm of transverse colon: Secondary | ICD-10-CM | POA: Diagnosis not present

## 2022-06-25 DIAGNOSIS — D122 Benign neoplasm of ascending colon: Secondary | ICD-10-CM | POA: Diagnosis not present

## 2022-06-25 DIAGNOSIS — Z09 Encounter for follow-up examination after completed treatment for conditions other than malignant neoplasm: Secondary | ICD-10-CM | POA: Diagnosis not present

## 2022-06-25 DIAGNOSIS — Z86010 Personal history of colonic polyps: Secondary | ICD-10-CM | POA: Diagnosis not present

## 2022-06-25 MED ORDER — SODIUM CHLORIDE 0.9 % IV SOLN: 500.0000 mL | Freq: Once | INTRAVENOUS | Status: DC

## 2022-06-25 NOTE — Progress Notes (Signed)
Report to pacu rn. Vss. Care resumed by rn. 

## 2022-06-25 NOTE — Progress Notes (Signed)
Called to room to assist during endoscopic procedure.  Patient ID and intended procedure confirmed with present staff. Received instructions for my participation in the procedure from the performing physician.  

## 2022-06-25 NOTE — Progress Notes (Signed)
[facility] History and Physical   Primary Care Physician:  Lenoria Chime, MD   Reason for Procedure:   Colon polyp surveillance  Plan:    Surveillance colonoscopy     HPI: Nichole Baker is a 61 y.o. female undergoing surveillance colonoscopy after she was was found to have 15 tubular adenomas on her initial average risk screening colonoscopy in September 2022.  She has no family history of colon cancer.   Past Medical History:  Diagnosis Date   Arthritis    GERD (gastroesophageal reflux disease)    Hypercholesterolemia    Neuromuscular disorder ([place])     Past Surgical History:  Procedure Laterality Date   CHOLECYSTECTOMY     LIPOMA EXCISION Left 02/19/2022   Procedure: EXCISION LEFT BACK  LIPOMA;  Surgeon: Erroll Luna, MD;  Location: [place];  Service: General;  Laterality: Left;    Prior to Admission medications   Medication Sig Start Date End Date Taking? Authorizing Provider  calcium carbonate (TUMS) 500 MG chewable tablet Chew 1 tablet (200 mg of elemental calcium total) by mouth daily as needed for indigestion or heartburn. 10/13/20   Sharion Settler, DO  ibuprofen (ADVIL) 800 MG tablet Take 1 tablet (800 mg total) by mouth every 8 (eight) hours as needed. Patient not taking: Reported on 05/14/2022 03/21/22   Erroll Luna, MD  oxyCODONE (OXY IR/ROXICODONE) 5 MG immediate release tablet Take 1 tablet (5 mg total) by mouth every 6 (six) hours as needed for severe pain. Patient not taking: Reported on 05/14/2022 02/19/22   Erroll Luna, MD  phenylephrine-shark liver oil-mineral oil-petrolatum (PREPARATION H) 0.25-14-74.9 % rectal ointment Place 1 application rectally 2 (two) times daily as needed for hemorrhoids. 11/09/20   Sharion Settler, DO    Current Outpatient Medications  Medication Sig Dispense Refill   calcium carbonate (TUMS) 500 MG chewable tablet Chew 1 tablet (200 mg of elemental calcium total) by mouth daily as  needed for indigestion or heartburn. 30 tablet 1   ibuprofen (ADVIL) 800 MG tablet Take 1 tablet (800 mg total) by mouth every 8 (eight) hours as needed. (Patient not taking: Reported on 05/14/2022) 30 tablet 0   oxyCODONE (OXY IR/ROXICODONE) 5 MG immediate release tablet Take 1 tablet (5 mg total) by mouth every 6 (six) hours as needed for severe pain. (Patient not taking: Reported on 05/14/2022) 15 tablet 0   phenylephrine-shark liver oil-mineral oil-petrolatum (PREPARATION H) 0.25-14-74.9 % rectal ointment Place 1 application rectally 2 (two) times daily as needed for hemorrhoids. 28 g 0   Current Facility-Administered Medications  Medication Dose Route Frequency Provider Last Rate Last Admin   0.9 %  sodium chloride infusion  500 mL Intravenous Once Daryel November, MD        Allergies as of 06/25/2022   (No Known Allergies)    Family History  Problem Relation Age of Onset   Rectal cancer Neg Hx    Stomach cancer Neg Hx    Colon cancer Neg Hx    Colon polyps Neg Hx    Esophageal cancer Neg Hx     Social History   Socioeconomic History   Marital status: Married    Spouse name: Not on file   Number of children: Not on file   Years of education: Not on file   Highest education level: Not on file  Occupational History   Not on file  Tobacco Use   Smoking status: Never   Smokeless tobacco: Never  Vaping Use  Vaping Use: Never used  Substance and Sexual Activity   Alcohol use: Never   Drug use: Never   Sexual activity: Yes    Birth control/protection: Post-menopausal  Other Topics Concern   Not on file  Social History Narrative   ** Merged History Encounter **       Social Determinants of Health   Financial Resource Strain: Not on file  Food Insecurity: Not on file  Transportation Needs: Not on file  Physical Activity: Not on file  Stress: Not on file  Social Connections: Not on file  Intimate Partner Violence: Not on file    Review of Systems:  All  other review of systems negative except as mentioned in the HPI.  Physical Exam: Vital signs BP (!) 141/80   Pulse 97   Temp (!) 96.9 F (36.1 C)   Ht 5' (1.524 m)   Wt 155 lb (70.3 kg)   SpO2 98%   BMI 30.27 kg/m   General:   Alert,  Well-developed, well-nourished, pleasant and cooperative in NAD Airway:  Mallampati 1 Lungs:  Clear throughout to auscultation.   Heart:  Regular rate and rhythm; no murmurs, clicks, rubs,  or gallops. Abdomen:  Soft, nontender and nondistended. Normal bowel sounds.   Neuro/Psych:  Normal mood and affect. A and O x 3   Aneisa Karren E. Candis Schatz, MD [facility]

## 2022-06-25 NOTE — Progress Notes (Signed)
Pt's states no medical or surgical changes since previsit or office visit. 

## 2022-06-25 NOTE — Progress Notes (Signed)
Interpreter present during admission

## 2022-06-25 NOTE — Patient Instructions (Signed)
-   Patient has a contact number available for emergencies. The signs and symptoms of potential delayed complications were discussed with the patient. Return to normal activities tomorrow. Written discharge instructions were provided to the patient. - Resume previous diet. - Continue present medications. - Await pathology results. - Repeat colonoscopy (date not yet determined) for surveillance based on pathology results. -Handout on polyps provided   YOU HAD AN ENDOSCOPIC PROCEDURE TODAY AT [facility]:   Refer to the procedure report that was given to you for any specific questions about what was found during the examination.  If the procedure report does not answer your questions, please call your gastroenterologist to clarify.  If you requested that your care partner not be given the details of your procedure findings, then the procedure report has been included in a sealed envelope for you to review at your convenience later.  YOU SHOULD EXPECT: Some feelings of bloating in the abdomen. Passage of more gas than usual.  Walking can help get rid of the air that was put into your GI tract during the procedure and reduce the bloating. If you had a lower endoscopy (such as a colonoscopy or flexible sigmoidoscopy) you may notice spotting of blood in your stool or on the toilet paper. If you underwent a bowel prep for your procedure, you may not have a normal bowel movement for a few days.  Please Note:  You might notice some irritation and congestion in your nose or some drainage.  This is from the oxygen used during your procedure.  There is no need for concern and it should clear up in a day or so.  SYMPTOMS TO REPORT IMMEDIATELY:  Following lower endoscopy (colonoscopy or flexible sigmoidoscopy):  Excessive amounts of blood in the stool  Significant tenderness or worsening of abdominal pains  Swelling of the abdomen that is new, acute  Fever of 100F or higher  For urgent or  emergent issues, a gastroenterologist can be reached at any hour by calling [phone]. Do not use MyChart messaging for urgent concerns.    DIET:  We do recommend a small meal at first, but then you may proceed to your regular diet.  Drink plenty of fluids but you should avoid alcoholic beverages for 24 hours.  ACTIVITY:  You should plan to take it easy for the rest of today and you should NOT DRIVE or use heavy machinery until tomorrow (because of the sedation medicines used during the test).    FOLLOW UP: Our staff will call the number listed on your records the next business day following your procedure.  We will call around 7:15- 8:00 am to check on you and address any questions or concerns that you may have regarding the information given to you following your procedure. If we do not reach you, we will leave a message.     If any biopsies were taken you will be contacted by phone or by letter within the next 1-3 weeks.  Please call us at [phone] if you have not heard about the biopsies in 3 weeks.    SIGNATURES/CONFIDENTIALITY: You and/or your care partner have signed paperwork which will be entered into your electronic medical record.  These signatures attest to the fact that that the information above on your After Visit Summary has been reviewed and is understood.  Full responsibility of the confidentiality of this discharge information lies with you and/or your care-partner.

## 2022-06-25 NOTE — Op Note (Signed)
[facility] Patient Name: Nichole Baker Procedure Date: 06/25/2022 8:03 AM MRN: [id number] Endoscopist: [place]. Candis Schatz , MD, [id number] Age: 61 Referring MD:  Date of Birth: March 22, 1961 Gender: Female Account #: [id number] Procedure:                Colonoscopy Indications:              Surveillance: History of numerous (> 10) adenomas                            on last colonoscopy (< 3 yrs), High risk colon                            cancer surveillance: Personal history of adenoma                            (10 mm or greater in size) Medicines:                Monitored Anesthesia Care Procedure:                Pre-Anesthesia Assessment:                           - Prior to the procedure, a History and Physical                            was performed, and patient medications and                            allergies were reviewed. The patient's tolerance of                            previous anesthesia was also reviewed. The risks                            and benefits of the procedure and the sedation                            options and risks were discussed with the patient.                            All questions were answered, and informed consent                            was obtained. Prior Anticoagulants: The patient has                            taken no anticoagulant or antiplatelet agents. ASA                            Grade Assessment: II - A patient with mild systemic                            disease. After reviewing the risks and benefits,  the patient was deemed in satisfactory condition to                            undergo the procedure.                           After obtaining informed consent, the colonoscope                            was passed under direct vision. Throughout the                            procedure, the patient's blood pressure, pulse, and                            oxygen saturations were  monitored continuously. The                            Olympus CF-HQ190L (Serial# [id number]) Colonoscope was                            introduced through the anus and advanced to the the                            terminal ileum, with identification of the                            appendiceal orifice and IC valve. The colonoscopy                            was performed without difficulty. The patient                            tolerated the procedure well. The quality of the                            bowel preparation was excellent. The terminal                            ileum, ileocecal valve, appendiceal orifice, and                            rectum were photographed. The bowel preparation                            used was Plenvu via split dose instruction. Scope In: 8:11:18 AM Scope Out: 8:26:12 AM Scope Withdrawal Time: 0 hours 12 minutes 23 seconds  Total Procedure Duration: 0 hours 14 minutes 54 seconds  Findings:                 The perianal and digital rectal examinations were                            normal. Pertinent negatives include normal  sphincter tone and no palpable rectal lesions.                           A 3 mm polyp was found in the ascending colon. The                            polyp was sessile. The polyp was removed with a                            cold snare. Resection and retrieval were complete.                            Estimated blood loss was minimal.                           A 2 mm polyp was found in the transverse colon. The                            polyp was sessile. The polyp was removed with a                            cold snare. Resection and retrieval were complete.                            Estimated blood loss was minimal.                           The exam was otherwise normal throughout the                            examined colon.                           The terminal ileum appeared normal.                            The retroflexed view of the distal rectum and anal                            verge was normal and showed no anal or rectal                            abnormalities. Complications:            No immediate complications. Estimated Blood Loss:     Estimated blood loss was minimal. Impression:               - One 3 mm polyp in the ascending colon, removed                            with a cold snare. Resected and retrieved.                           - One 2 mm polyp in the transverse colon, removed  with a cold snare. Resected and retrieved.                           - The examined portion of the ileum was normal.                           - The distal rectum and anal verge are normal on                            retroflexion view. Recommendation:           - Patient has a contact number available for                            emergencies. The signs and symptoms of potential                            delayed complications were discussed with the                            patient. Return to normal activities tomorrow.                            Written discharge instructions were provided to the                            patient.                           - Resume previous diet.                           - Continue present medications.                           - Await pathology results.                           - Repeat colonoscopy (date not yet determined) for                            surveillance based on pathology results. Tanasia Budzinski E. Candis Schatz, MD 06/25/2022 8:36:00 AM This report has been signed electronically.

## 2022-06-25 NOTE — Progress Notes (Signed)
Discharge instruction given via interpreter.

## 2022-06-26 ENCOUNTER — Telehealth: Payer: Self-pay

## 2022-06-26 NOTE — Telephone Encounter (Signed)
  Follow up Call-     06/25/2022    7:28 AM 03/29/2021    1:00 PM  Call back number  Post procedure Call Back phone  # [phone]  Permission to leave phone message Yes Yes    Post op call attempted, no answer, left WM.

## 2022-06-26 NOTE — Progress Notes (Deleted)
    SUBJECTIVE:   CHIEF COMPLAINT / HPI:   ***  PERTINENT  PMH / PSH: prediabetes, hemorrhoids, elevated LDL  OBJECTIVE:   There were no vitals taken for this visit.  ***  ASSESSMENT/PLAN:   No problem-specific Assessment & Plan notes found for this encounter.     Lenoria Chime, MD [place]

## 2022-07-02 NOTE — Progress Notes (Signed)
Nichole Baker,  The two polyps which I removed during your recent procedure were proven to be completely benign but are considered "pre-cancerous" polyps that MAY have grown into cancer if they had not been removed. Based on your history of having >10 precancerous polyps a year ago, I recommend that you have a repeat colonoscopy in 3 years.   If you develop any new rectal bleeding, abdominal pain or significant bowel habit changes, please contact me before then.

## 2022-07-03 ENCOUNTER — Ambulatory Visit: Payer: Medicaid Other | Admitting: Family Medicine

## 2022-11-04 ENCOUNTER — Other Ambulatory Visit: Payer: Self-pay | Admitting: Family Medicine

## 2022-11-04 DIAGNOSIS — Z1231 Encounter for screening mammogram for malignant neoplasm of breast: Secondary | ICD-10-CM

## 2022-12-06 ENCOUNTER — Ambulatory Visit
Admission: RE | Admit: 2022-12-06 | Discharge: 2022-12-06 | Disposition: A | Discharge status: Home / Self Care | Payer: Medicaid Other | Source: Ambulatory Visit | Attending: Family Medicine | Admitting: Family Medicine

## 2022-12-06 DIAGNOSIS — Z1231 Encounter for screening mammogram for malignant neoplasm of breast: Secondary | ICD-10-CM

## 2022-12-14 IMAGING — US US BREAST LTD UNI LEFT INC AXILLA
1 series · 13 of 17 positions shown · non-contrast
Comparison: Previous exam(s).

CLINICAL DATA: Palpable lump in the medial left breast. Pain in the
lateral left breast.



[Series 1: us breast ltd uni left inc axilla · 0.09mm/px · 13 of 17 slices shown]
[im 1/17]
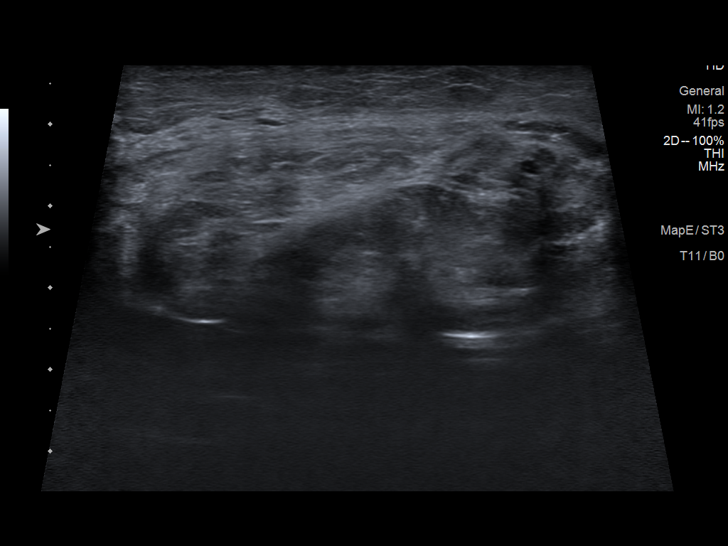
[im 2/17]
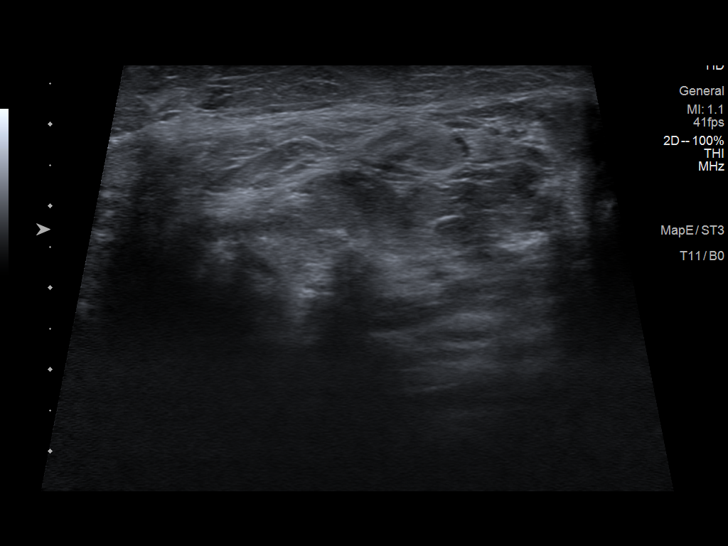
[im 4/17]
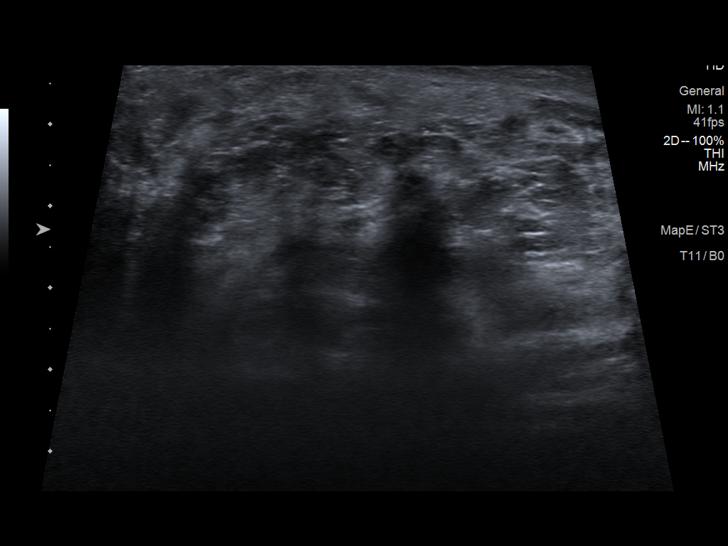
[im 5/17]
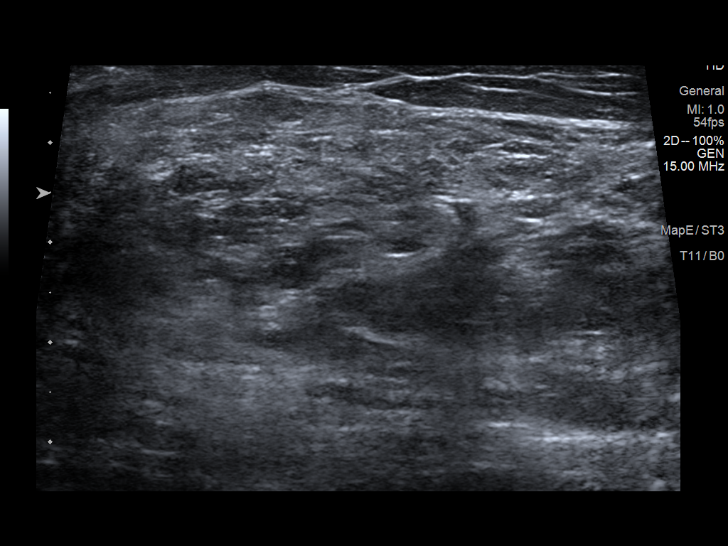
[im 6/17]
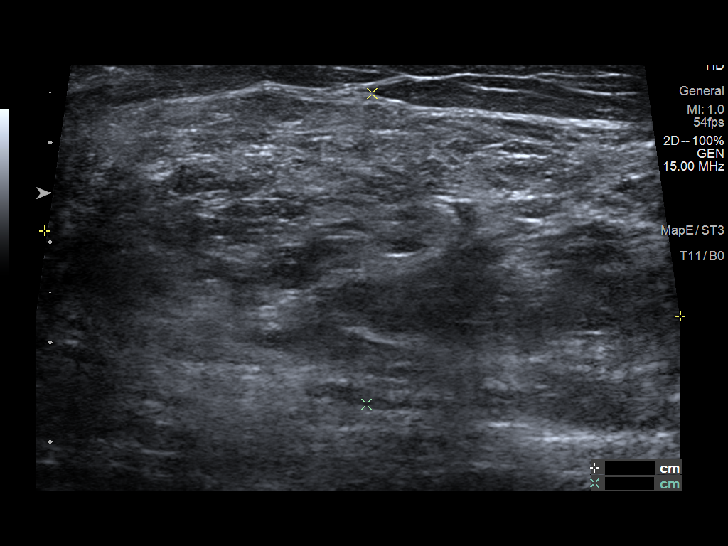
[im 8/17]
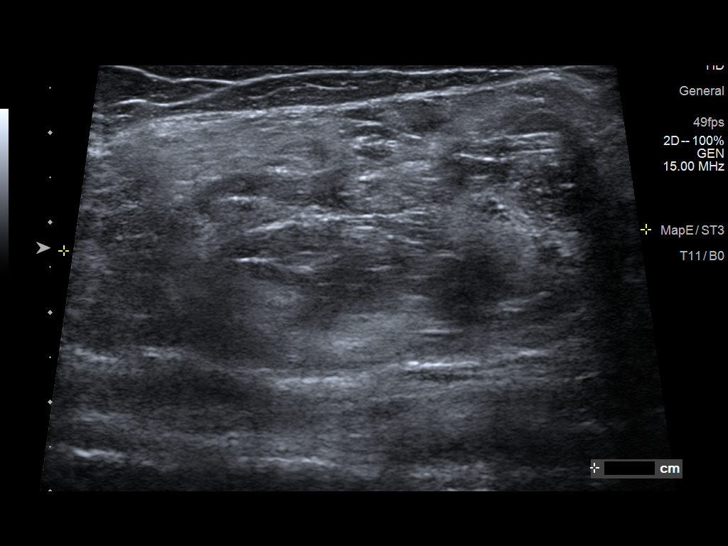
[im 9/17]
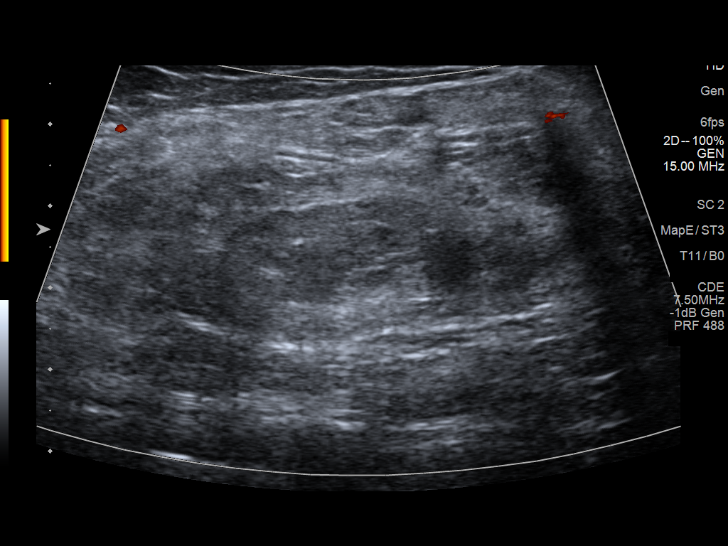
[im 10/17]
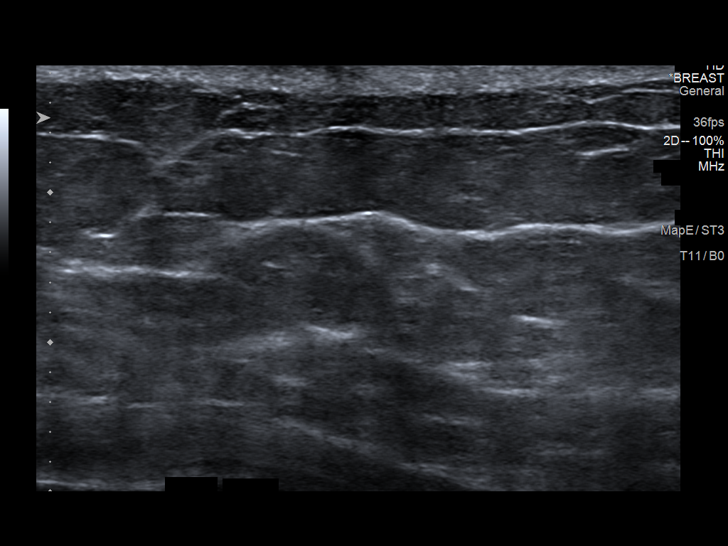
[im 12/17]
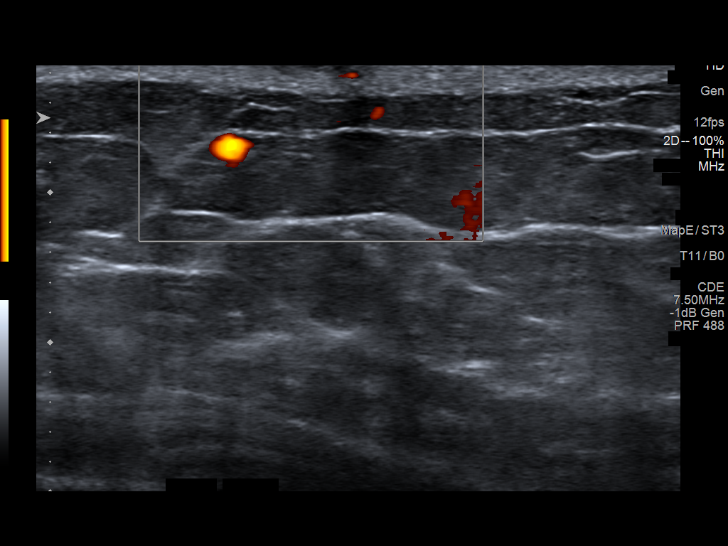
[im 13/17]
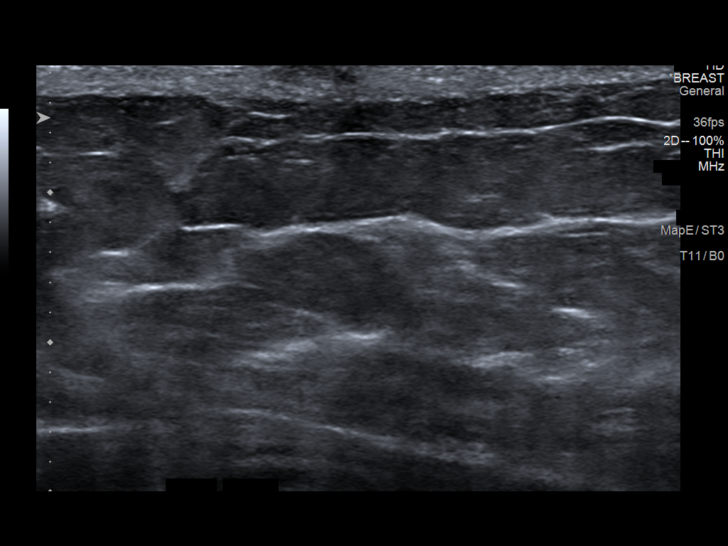
[im 14/17]
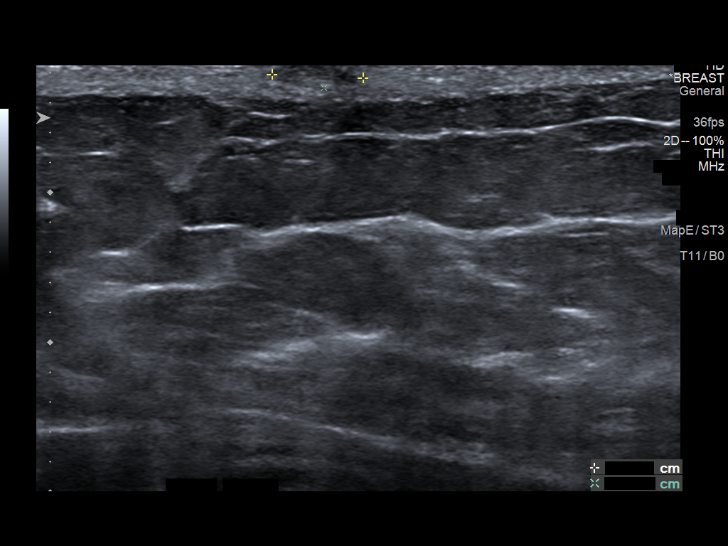
[im 16/17]
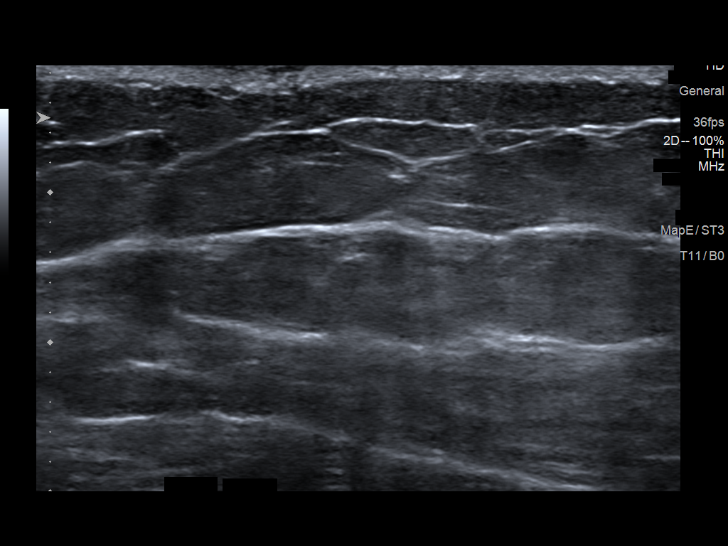
[im 17/17]
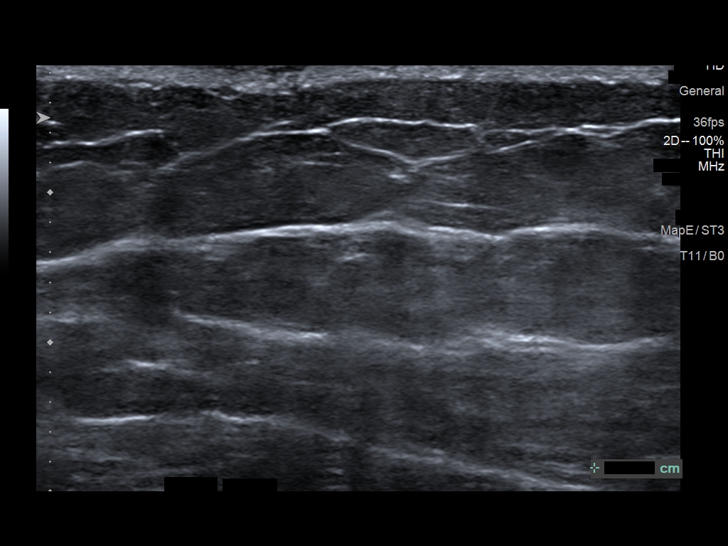

[13 of 17 positions shown; findings below may reference images not displayed]

ACR Breast Density Category b: There are scattered areas of
fibroglandular density.
FINDINGS: There is a nodular pattern in the inferior right breast. No other
suspicious findings on the right.

No abnormalities seen in the region of the medial lump on the left.
There is a large hamartoma correlating with the region of pain in
the lateral left breast.

Targeted ultrasound is performed, showing fibrocystic changes in the
inferior right breast correlating with mammographic findings.

There is a large hamartoma correlating with the patient's pain also
seen mammographically. This hamartoma measures at least 9.5 cm seen
mammographically.

The lump in the medial right breast is a skin based lesion
consistent with the visible appearance of a blackhead. No breast
abnormality in this region.
IMPRESSION: The lump in the medial left breast correlates with a blackhead and
is skin based. The region of pain corresponds with a benign
hamartoma requiring no follow-up. The nodular pattern in the
inferior right breast represents fibrocystic change. No other
suspicious mammographic or sonographic abnormalities.

RECOMMENDATION:
Annual screening mammography.

I have discussed the findings and recommendations with the patient.
If applicable, a reminder letter will be sent to the patient
regarding the next appointment.

BI-RADS CATEGORY  2: Benign.

## 2022-12-30 ENCOUNTER — Encounter: Payer: Self-pay | Admitting: Family Medicine

## 2022-12-30 ENCOUNTER — Ambulatory Visit (INDEPENDENT_AMBULATORY_CARE_PROVIDER_SITE_OTHER): Payer: Medicaid Other | Admitting: Family Medicine

## 2022-12-30 VITALS — BP 118/64 | HR 80 | Wt 155.0 lb

## 2022-12-30 DIAGNOSIS — Z23 Encounter for immunization: Secondary | ICD-10-CM | POA: Diagnosis not present

## 2022-12-30 DIAGNOSIS — R7303 Prediabetes: Secondary | ICD-10-CM | POA: Diagnosis present

## 2022-12-30 DIAGNOSIS — E7800 Pure hypercholesterolemia, unspecified: Secondary | ICD-10-CM

## 2022-12-30 DIAGNOSIS — L918 Other hypertrophic disorders of the skin: Secondary | ICD-10-CM

## 2022-12-30 LAB — POCT GLYCOSYLATED HEMOGLOBIN (HGB A1C): HbA1c, POC (prediabetic range): 5.8 % (ref 5.7–6.4)

## 2022-12-30 MED ORDER — ZOSTER VAC RECOMB ADJUVANTED 50 MCG/0.5ML IM SUSR: 0.5000 mL | Freq: Once | INTRAMUSCULAR | 0 refills | Status: AC

## 2022-12-30 NOTE — Assessment & Plan Note (Signed)
Recheck lipid panel today after diet and exercise changes

## 2022-12-30 NOTE — Patient Instructions (Addendum)
It was wonderful to see you today.  Please bring ALL of your medications with you to every visit.   Today we talked about:  Normal mammogram  We checked your cholesterol today.  Given a script for the Shingles vaccine to take to the pharmacy. You will need two shots, one now and then one 2-6 months after the first.  Your prediabetes looks better.  Thank you for choosing [facility].   Please call [phone] with any questions about today's appointment.  Please arrive at least 15 minutes prior to your scheduled appointments.   If you had blood work today, I will send you a MyChart message or a letter if results are normal. Otherwise, I will give you a call.   If you had a referral placed, they will call you to set up an appointment. Please give [place] a call if you don't hear back in the next 2 weeks.   If you need additional refills before your next appointment, please call your pharmacy first.   Burley Saver, MD  Family Medicine

## 2022-12-30 NOTE — Progress Notes (Signed)
    SUBJECTIVE:   CHIEF COMPLAINT / HPI:   Skin tag of ear- missed dermatology referral and still itching and would like another referral to have it removed.   Prediabetes- recheck today Hgb A1c was 5.8. She has been walking 45 min to 1 hr daily and has changed to her diet to have less oils and fats and more vegetables. She would like to recheck her cholesterol as well.  Discussed mammogram results were normal.  Shingles vaccine, COVID booster- declined COVID booster but would like Shingles vaccine.  PERTINENT  PMH / PSH: high cholesterol, prediabetes  OBJECTIVE:   BP 118/64   Pulse 80   Wt 155 lb (70.3 kg)   SpO2 98%   BMI 30.27 kg/m   General: A&O, NAD HEENT: No sign of trauma, EOM grossly intact Cardiac: RRR, no m/r/g Respiratory: CTAB, normal WOB, no w/c/r GI: Soft, NTTP, non-distended  Extremities: NTTP, no peripheral edema. Neuro: Normal gait, moves all four extremities appropriately. Psych: Appropriate mood and affect   ASSESSMENT/PLAN:   Prediabetes A1c improved, congratulated patient, continue diet exercise changes. F/u 6 months  Elevated LDL cholesterol level Recheck lipid panel today after diet and exercise changes  Skin tag of ear Referral replaced to dermatology, indicating needs [facility] clinic  Need for shingles vaccine Printed today and explained how to get to pharmacy     Billey Co, MD [facility]

## 2022-12-30 NOTE — Assessment & Plan Note (Signed)
A1c improved, congratulated patient, continue diet exercise changes. F/u 6 months

## 2022-12-30 NOTE — Assessment & Plan Note (Signed)
Referral replaced to dermatology, indicating needs [facility] clinic

## 2022-12-30 NOTE — Assessment & Plan Note (Signed)
Printed today and explained how to get to pharmacy

## 2022-12-31 LAB — LIPID PANEL
Chol/HDL Ratio: 5.3 ratio — ABNORMAL HIGH (ref 0.0–4.4)
Cholesterol, Total: 223 mg/dL — ABNORMAL HIGH (ref 100–199)
HDL: 42 mg/dL (ref >39)
LDL Chol Calc (NIH): 143 mg/dL — ABNORMAL HIGH (ref 0–99)
Triglycerides: 210 mg/dL — ABNORMAL HIGH (ref 0–149)
VLDL Cholesterol Cal: 38 mg/dL (ref 5–40)

## 2023-02-24 ENCOUNTER — Ambulatory Visit (INDEPENDENT_AMBULATORY_CARE_PROVIDER_SITE_OTHER): Payer: Medicaid Other | Admitting: Family Medicine

## 2023-02-24 ENCOUNTER — Encounter: Payer: Self-pay | Admitting: Family Medicine

## 2023-02-24 VITALS — BP 120/70 | HR 86 | Ht 60.0 in | Wt 156.0 lb

## 2023-02-24 DIAGNOSIS — L918 Other hypertrophic disorders of the skin: Secondary | ICD-10-CM | POA: Diagnosis present

## 2023-02-24 DIAGNOSIS — H93A1 Pulsatile tinnitus, right ear: Secondary | ICD-10-CM | POA: Diagnosis not present

## 2023-02-24 DIAGNOSIS — Z23 Encounter for immunization: Secondary | ICD-10-CM | POA: Diagnosis not present

## 2023-02-24 NOTE — Progress Notes (Signed)
    SUBJECTIVE:   CHIEF COMPLAINT / HPI:   Green card vaccines- due for Hep B and adult polio (needs to get at [facility]).   Got first Shingles vaccine 01/13/23, can get next dose in 2-6 months.   Skin tag of ear- referred to [facility] dermatology, has not gotten appt scheduled yet.  R ear pulsatile tinnitus- past 3 days, hearing a noise in her ear. No decreased hearing. No ear pain or drainage. Noise sounds "like water," comes and goes, sounds "like a train is moving." Sounds as a rail is moving. It is pulsatile. Nothing like this before. No past head trauma. No loss of vision or double vision.  No headaches since this started. No nausea or vomiting. No recent exposure to loud noises. Saw ENT Dr Pollyann Kennedy 01/09/2021 with normal ear exam and no signs of cholesteatoma. Denies numbness or weakness in face or extremities. Happens a lot throughout the day, too often to count. Never had anything like this before.  PERTINENT  PMH / PSH: elevated LDL, prediabetes  OBJECTIVE:   BP 120/70   Pulse 86   Ht 5' (1.524 m)   Wt 156 lb (70.8 kg)   SpO2 99%   BMI 30.47 kg/m   General: A&O, NAD HEENT: No sign of trauma, PERRL, EOM intact, b/l TM clear without retraction, mass, or effusion, no wax in ear canal, no bleeding or mass appreciated, no TTP over mastoid bone or temple Respiratory: normal respiratory effort GI: non-distended Skin: ear growth on back of R ear, same in size as my previous exams Neuro: CN II-XII intact, Normal gait, moves all four extremities appropriately. Psych: Appropriate mood and affect   ASSESSMENT/PLAN:   Skin tag of ear [facility] referral previously placed Called and left VM with scheduling to help pt schedule appt  Pulsatile tinnitus, right ear Started in last 3 days, no history of trauma, no other neurologic symptoms Discussed head imaging, pt prefers to wait at this time as it has just started Scheduled 1 week f/u, if symptoms persistent recommend MRI  brain/IAC w and w/o contrast Return precautions discussed- worsening headaches, vision changes/loss, nausea vomiting, fevers chills, numbness or weakness- to go to ED   Hep B vaccine given today. ' Given address for [facility] to get adult polio vaccine as needed for green card.  Billey Co, MD [facility]

## 2023-02-24 NOTE — Patient Instructions (Addendum)
It was wonderful to see you today.  Please bring ALL of your medications with you to every visit.   Today we talked about:  We gave you the Hep B vaccine  You will need to go to the [facility] department at [address] to get your polio vaccine for the green card!  For your skin tag on your ear:  [facility]- we will work on appt Address: [address] [place], [place] [address] Phone: [phone]  Thank you for choosing [facility].   Please call [phone] with any questions about today's appointment.  Please arrive at least 15 minutes prior to your scheduled appointments.   If you had blood work today, I will send you a MyChart message or a letter if results are normal. Otherwise, I will give you a call.   If you had a referral placed, they will call you to set up an appointment. Please give [place] a call if you don't hear back in the next 2 weeks.   If you need additional refills before your next appointment, please call your pharmacy first.   Burley Saver, MD  Family Medicine

## 2023-02-24 NOTE — Assessment & Plan Note (Addendum)
Started in last 3 days, no history of trauma, no other neurologic symptoms Discussed head imaging, pt prefers to wait at this time as it has just started Scheduled 1 week f/u, if symptoms persistent recommend MRI brain/IAC w and w/o contrast Return precautions discussed- worsening headaches, vision changes/loss, nausea vomiting, fevers chills, numbness or weakness- to go to ED

## 2023-02-24 NOTE — Assessment & Plan Note (Addendum)
[facility] referral previously placed Called and left VM with scheduling to help pt schedule appt

## 2023-03-04 ENCOUNTER — Ambulatory Visit (INDEPENDENT_AMBULATORY_CARE_PROVIDER_SITE_OTHER): Payer: Medicaid Other | Admitting: Family Medicine

## 2023-03-04 ENCOUNTER — Encounter: Payer: Self-pay | Admitting: Family Medicine

## 2023-03-04 VITALS — BP 144/83 | HR 76 | Ht 60.0 in | Wt 156.0 lb

## 2023-03-04 DIAGNOSIS — H93A1 Pulsatile tinnitus, right ear: Secondary | ICD-10-CM | POA: Diagnosis present

## 2023-03-04 DIAGNOSIS — R030 Elevated blood-pressure reading, without diagnosis of hypertension: Secondary | ICD-10-CM

## 2023-03-04 NOTE — Progress Notes (Signed)
    SUBJECTIVE:   CHIEF COMPLAINT / HPI:   Follow up tinnitus- still occurring throughout the day, pulsatile sometimes, "like the rail of a train coming and going." Notes sometimes with R ear pain, feels inside ear, but not tender to palpitation. No hearing loss. No headaches. No worsening with chewing or associated jaw/tooth pain. No vision changes, numbness, or weakness. Is ok with MRI and does not get claustrophobic.  Elevated blood pressure x2 without diagnosis of hypertension- previous BP have all been normal. Does note some R sided pain associated with her tinnitus.   OBJECTIVE:   BP (!) 144/83   Pulse 76   Ht 5' (1.524 m)   Wt 156 lb (70.8 kg)   SpO2 100%   BMI 30.47 kg/m   General: alert & oriented, no apparent distress, well groomed HEENT: normocephalic, atraumatic, PERRL, EOMI, oral mucosa moist, neck supple, R TM clear without infection, effusion, or mass, ear canal without abnormality, R ear without pain on pulling on pinnae, no mastoid TTP, no TTP of R temple or forehead Respiratory: normal respiratory effort GI: non-distended Skin: no rashes, no jaundice Psych: appropriate mood and affect   ASSESSMENT/PLAN:   Elevated blood pressure reading without diagnosis of hypertension BMP today, previously has been normotensive, will repeat at follow up in 2-4 weeks  Pulsatile tinnitus, right ear MRI IAC w/ and w/o contrast per ACR guidelines NO hearing loss, no abnormalities on ear exam Consider ENT referral if no improvement     Billey Co, MD [facility]

## 2023-03-04 NOTE — Assessment & Plan Note (Signed)
BMP today, previously has been normotensive, will repeat at follow up in 2-4 weeks

## 2023-03-04 NOTE — Assessment & Plan Note (Signed)
MRI IAC w/ and w/o contrast per ACR guidelines NO hearing loss, no abnormalities on ear exam Consider ENT referral if no improvement

## 2023-03-04 NOTE — Patient Instructions (Addendum)
It was wonderful to see you today.  Please bring ALL of your medications with you to every visit.   Today we talked about:  - We have scheduled an MRI for you today  - Your blood pressure was a little elevated today but I think it may be due to pain. You can take tylenol 500 mg four times a day as needed for the pain. If your pain gets worse, if you have nausea or vomiting, numbness, weakness, or vision changes, please go to the ED immediately.  Thank you for choosing [facility].   Please call [phone] with any questions about today's appointment.  Please arrive at least 15 minutes prior to your scheduled appointments.   If you had blood work today, I will send you a MyChart message or a letter if results are normal. Otherwise, I will give you a call.   If you had a referral placed, they will call you to set up an appointment. Please give [place] a call if you don't hear back in the next 2 weeks.   If you need additional refills before your next appointment, please call your pharmacy first.   Burley Saver, MD  Family Medicine

## 2023-03-05 LAB — BASIC METABOLIC PANEL
BUN/Creatinine Ratio: 23 (ref 12–28)
BUN: 17 mg/dL (ref 8–27)
CO2: 20 mmol/L (ref 20–29)
Calcium: 9.1 mg/dL (ref 8.7–10.3)
Chloride: 105 mmol/L (ref 96–106)
Creatinine, Ser: 0.73 mg/dL (ref 0.57–1.00)
Glucose: 109 mg/dL — ABNORMAL HIGH (ref 70–99)
Potassium: 4.4 mmol/L (ref 3.5–5.2)
Sodium: 138 mmol/L (ref 134–144)
eGFR: 94 mL/min/{1.73_m2} (ref >59)

## 2023-03-07 ENCOUNTER — Other Ambulatory Visit: Payer: Self-pay

## 2023-03-07 ENCOUNTER — Ambulatory Visit (HOSPITAL_COMMUNITY)
Admission: EM | Admit: 2023-03-07 | Discharge: 2023-03-07 | Disposition: A | Discharge status: Home / Self Care | Payer: Medicaid Other | Attending: Internal Medicine | Admitting: Internal Medicine

## 2023-03-07 ENCOUNTER — Encounter (HOSPITAL_COMMUNITY): Payer: Self-pay | Admitting: *Deleted

## 2023-03-07 DIAGNOSIS — U071 COVID-19: Secondary | ICD-10-CM | POA: Insufficient documentation

## 2023-03-07 DIAGNOSIS — R112 Nausea with vomiting, unspecified: Secondary | ICD-10-CM | POA: Insufficient documentation

## 2023-03-07 DIAGNOSIS — J069 Acute upper respiratory infection, unspecified: Secondary | ICD-10-CM | POA: Insufficient documentation

## 2023-03-07 MED ORDER — ONDANSETRON 4 MG PO TBDP
ORAL_TABLET | ORAL | Status: AC
  Filled 2023-03-07: qty 1

## 2023-03-07 MED ORDER — ONDANSETRON 4 MG PO TBDP
4.0000 mg | ORAL_TABLET | Freq: Once | ORAL | Status: AC
  Administered 2023-03-07: 4 mg via ORAL

## 2023-03-07 MED ORDER — BENZONATATE 100 MG PO CAPS: 100.0000 mg | ORAL_CAPSULE | Freq: Three times a day (TID) | ORAL | 0 refills | Status: AC

## 2023-03-07 MED ORDER — ONDANSETRON 4 MG PO TBDP: 4.0000 mg | ORAL_TABLET | Freq: Three times a day (TID) | ORAL | 0 refills | Status: AC | PRN

## 2023-03-07 MED ORDER — PROMETHAZINE-DM 6.25-15 MG/5ML PO SYRP: 5.0000 mL | ORAL_SOLUTION | Freq: Every evening | ORAL | 0 refills | Status: AC | PRN

## 2023-03-07 NOTE — ED Provider Notes (Incomplete)
[facility]    CSN: [id number] Arrival date & time: 03/07/23  1215      History   Chief Complaint Chief Complaint  Patient presents with   Headache   Generalized Body Aches   Fever   Nausea    HPI Nichole Baker is a 62 y.o. female.   Patient presents to urgent care for evaluation of bodyaches, chills, nausea, vomiting, generalized headache, and cough that started yesterday.  She has had 2 episodes of nonbilious/nonbloody emesis today and is currently nauseous.  Headache is to the generalized right side of the head and currently mild.  Unsure of max temp at home. No vision changes, dizziness, chest pain, heart palpitations, or recent sick contacts with similar symptoms. Non-smoker, no shortness of breath or recent changes in diet. Has not attempted use of any OTC medications to help with symptoms PTA.   The history is provided by the patient and a relative. A language interpreter was used (Patient's son provided interpretation for entirety of patient encounter, patient offered medical interpreter and declined this.).  Headache Associated symptoms: fever   Fever Associated symptoms: headaches     Past Medical History:  Diagnosis Date   Arthritis    GERD (gastroesophageal reflux disease)    Hypercholesterolemia    Neuromuscular disorder 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      Home Medications    Prior to Admission medications   Medication Sig Start Date End Date Taking? Authorizing Provider  benzonatate (TESSALON) 100 MG capsule Take 1 capsule (100 mg total) by mouth every 8 (eight) hours. 03/07/23  Yes Carlisle Beers, FNP  ondansetron (ZOFRAN-ODT) 4 MG disintegrating tablet Take 1 tablet (4 mg total) by mouth every 8 (eight) hours as needed for nausea or vomiting. 03/07/23  Yes Carlisle Beers, FNP  promethazine-dextromethorphan (PROMETHAZINE-DM) 6.25-15 MG/5ML syrup Take 5 mLs by mouth at bedtime as needed for cough. 03/07/23  Yes StanhopeDonavan Burnet, FNP    Family History Family History  Problem Relation Age of Onset   Rectal cancer Neg Hx    Stomach cancer Neg Hx    Colon cancer Neg Hx    Colon polyps Neg Hx    Esophageal cancer Neg Hx     Social History Social History   Tobacco Use   Smoking status: Never    Passive exposure: Never   Smokeless tobacco: Never  Vaping Use   Vaping status: Never Used  Substance Use Topics   Alcohol use: Never   Drug use: Never     Allergies   Patient has no known allergies.   Review of Systems Review of Systems  Constitutional:  Positive for fever.  Neurological:  Positive for headaches.  Per HPI  Physical Exam Triage Vital Signs ED Triage Vitals  Encounter Vitals Group     BP 03/07/23 1258 132/85     Systolic BP Percentile --      Diastolic BP Percentile --      Pulse Rate 03/07/23 1258 98     Resp 03/07/23 1258 20     Temp 03/07/23 1258 98.4 F (36.9 C)     Temp src --      SpO2 03/07/23 1258 96 %     Weight --      Height --      Head Circumference --      Peak Flow --      Pain Score 03/07/23 1255 4     Pain Loc --      Pain Education --      Exclude from Growth Chart --    No data found.  Updated Vital Signs BP 132/85   Pulse 98   Temp 98.4 F (36.9  C)   Resp 20   SpO2 96%   Visual Acuity Right Eye Distance:   Left Eye Distance:   Bilateral Distance:    Right Eye Near:   Left Eye Near:    Bilateral Near:     Physical Exam Vitals and nursing note reviewed.  Constitutional:      Appearance: She is not ill-appearing or toxic-appearing.  HENT:     Head: Normocephalic and atraumatic.     Right Ear: Hearing, tympanic membrane, ear canal and external ear normal.     Left Ear: Hearing, tympanic membrane, ear canal and external ear normal.     Nose: Nose normal.     Mouth/Throat:     Lips: Pink.     Mouth: Mucous membranes are moist. No injury.     Tongue: No lesions. Tongue does not deviate from midline.     Palate: No mass and lesions.     Pharynx: Oropharynx is clear. Uvula midline. No pharyngeal swelling, oropharyngeal exudate, posterior oropharyngeal erythema or uvula swelling.     Tonsils: No tonsillar exudate or tonsillar abscesses.  Eyes:     General: Lids are normal. Vision grossly intact. Gaze aligned appropriately.     Extraocular Movements: Extraocular movements intact.     Conjunctiva/sclera: Conjunctivae normal.  Cardiovascular:     Rate and Rhythm: Normal rate and regular rhythm.     Heart sounds: Normal heart sounds, S1 normal and S2 normal.  Pulmonary:     Effort: Pulmonary effort is normal. No respiratory distress.     Breath sounds: Normal breath sounds and air entry. No wheezing, rhonchi or rales.  Chest:     Chest wall: No tenderness.  Musculoskeletal:     Cervical back: Neck supple.  Skin:    General: Skin is warm and dry.     Capillary Refill: Capillary refill takes less than 2 seconds.     Findings: No rash.  Neurological:     General: No focal deficit present.     Mental Status: She is alert and oriented to person, place, and time. Mental status is at baseline.     Cranial Nerves: No dysarthria or facial asymmetry.  Psychiatric:        Mood and Affect: Mood normal.        Speech: Speech  normal.        Behavior: Behavior normal.        Thought Content: Thought content normal.        Judgment: Judgment  normal.      UC Treatments / Results  Labs (all labs ordered are listed, but only abnormal results are displayed) Labs Reviewed  SARS CORONAVIRUS 2 (TAT 6-24 HRS) - Abnormal; Notable for the following components:      Result Value   SARS Coronavirus 2 POSITIVE (*)    All other components within normal limits    EKG   Radiology No results found.  Procedures Procedures (including critical care time)  Medications Ordered in UC Medications  ondansetron (ZOFRAN-ODT) disintegrating tablet 4 mg (4 mg Oral Given 03/07/23 1339)    Initial Impression / Assessment and Plan / UC Course  I have reviewed the triage vital signs and the nursing notes.  Pertinent labs & imaging results that were available during my care of the patient were reviewed by me and considered in my medical decision making (see chart for details).   1. Viral URI with cough, nausea and vomiting Evaluation suggests viral URI etiology. Will manage this with recommendations for OTC and prescription medications for symptomatic relief. Encouraged to push fluids to stay well hydrated.  Imaging: deferred based on stable cardiopulmonary exam/hemodynamically stable vital signs Prescriptions sent for further symptomatic relief, may continue using OTC medications as needed. Medications given in clinic: zofran 4mg  ODT Strep/Viral testing: COVID testing pending.  Counseled patient on potential for adverse effects with medications prescribed/recommended today, strict ER and return-to-clinic precautions discussed, patient verbalized understanding.    Final Clinical Impressions(s) / UC Diagnoses   Final diagnoses:  Viral URI with cough  Nausea and vomiting, unspecified vomiting type     Discharge Instructions      Your symptoms are most likely due to a viral illness, which will improve on its own with  rest and fluids. COVID testing is pending, staff will call you if this is positive. Wear a mask for 5 days of symptoms while you are in public, then you may remove your mask. You may go back to work if you do not have a fever for 24 hours without any medicines.  - Take prescribed medicines to help with symptoms: tessalon perles, promethazine DM, zofran - Use over the counter medicines to help with symptoms as discussed: Ibuprofen, tylenol, guaifenesin (mucinex), zyrtec, etc - Two teaspoons of honey in warm water every 4-6 hours may help with throat pains - Humidifier in your room at night to help add water the air and soothe cough  If you develop any new or worsening symptoms or do not improve in the next 2 to 3 days, please return.  If your symptoms are severe, please go to the emergency room.  Follow-up with PCP as needed.     ED Prescriptions     Medication Sig Dispense Auth. Provider   benzonatate (TESSALON) 100 MG capsule Take 1 capsule (100 mg total) by mouth every 8 (eight) hours. 21 capsule Carlisle Beers, FNP   promethazine-dextromethorphan (PROMETHAZINE-DM) 6.25-15 MG/5ML syrup Take 5 mLs by mouth at bedtime as needed for cough. 118 mL Reita May M, FNP   ondansetron (ZOFRAN-ODT) 4 MG disintegrating tablet Take 1 tablet (4 mg total) by mouth every 8 (eight) hours as needed for nausea or vomiting. 20 tablet Carlisle Beers, FNP      PDMP not reviewed this encounter.   Carlisle Beers, FNP 03/13/23 1324    Carlisle Beers, FNP 03/13/23 1325

## 2023-03-07 NOTE — Discharge Instructions (Signed)
Your symptoms are most likely due to a viral illness, which will improve on its own with rest and fluids. COVID testing is pending, staff will call you if this is positive. Wear a mask for 5 days of symptoms while you are in public, then you may remove your mask. You may go back to work if you do not have a fever for 24 hours without any medicines.  - Take prescribed medicines to help with symptoms: tessalon perles, promethazine DM, zofran - Use over the counter medicines to help with symptoms as discussed: Ibuprofen, tylenol, guaifenesin (mucinex), zyrtec, etc - Two teaspoons of honey in warm water every 4-6 hours may help with throat pains - Humidifier in your room at night to help add water the air and soothe cough  If you develop any new or worsening symptoms or do not improve in the next 2 to 3 days, please return.  If your symptoms are severe, please go to the emergency room.  Follow-up with PCP as needed.

## 2023-03-07 NOTE — ED Triage Notes (Addendum)
Since yesterday Pt has had chills,fever ,general body aches . Pt has had rt ear pain with pain to Rt neck for one month. Family reports Pt vomited today while at [facility].

## 2023-03-08 LAB — SARS CORONAVIRUS 2 (TAT 6-24 HRS): SARS Coronavirus 2: POSITIVE — AB

## 2023-03-18 ENCOUNTER — Ambulatory Visit (HOSPITAL_COMMUNITY): Payer: Medicaid Other

## 2024-01-05 IMAGING — MG MM DIGITAL SCREENING BILAT W/ TOMO AND CAD
6 of 12 series · 6 of 36 positions shown · non-contrast
Comparison: Previous exam(s).

CLINICAL DATA: Screening.

EXAM:
DIGITAL SCREENING BILATERAL MAMMOGRAM WITH TOMOSYNTHESIS AND CAD
TECHNIQUE: Bilateral screening digital craniocaudal and mediolateral oblique
mammograms were obtained. Bilateral screening digital breast
tomosynthesis was performed. The images were evaluated with
computer-aided detection.

[R MLO synth-2D (1 of 2)]
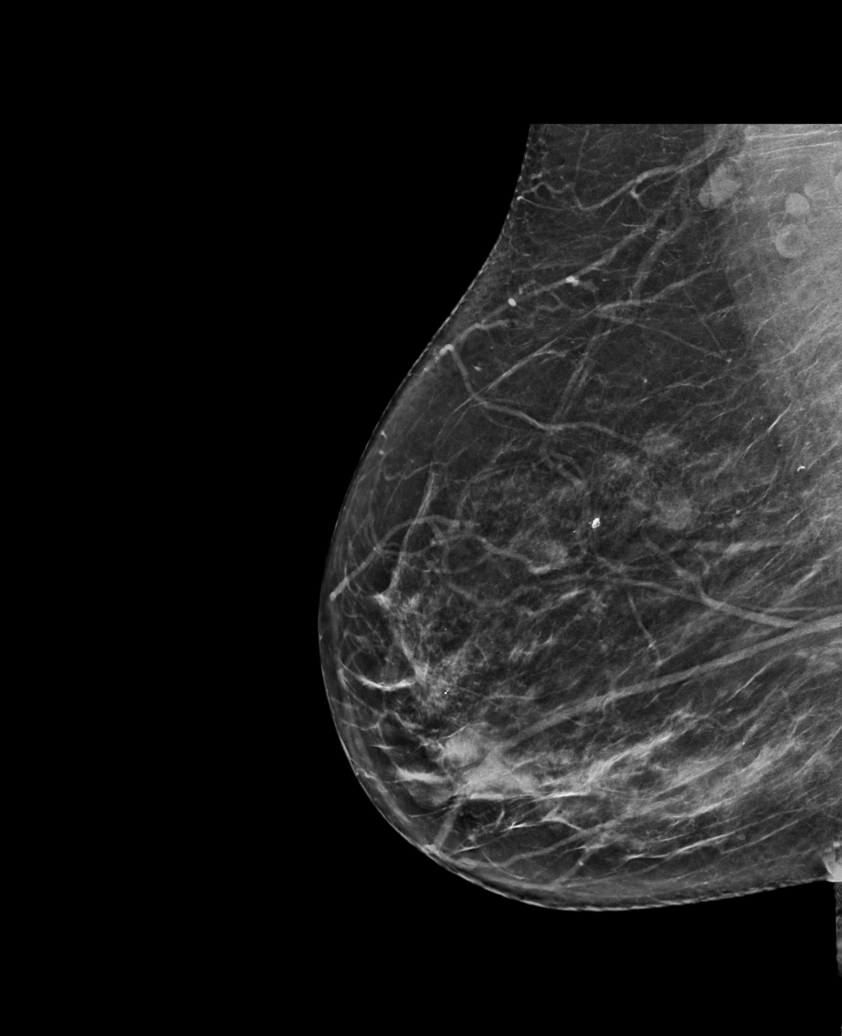

[R MLO synth-2D (2 of 2)]
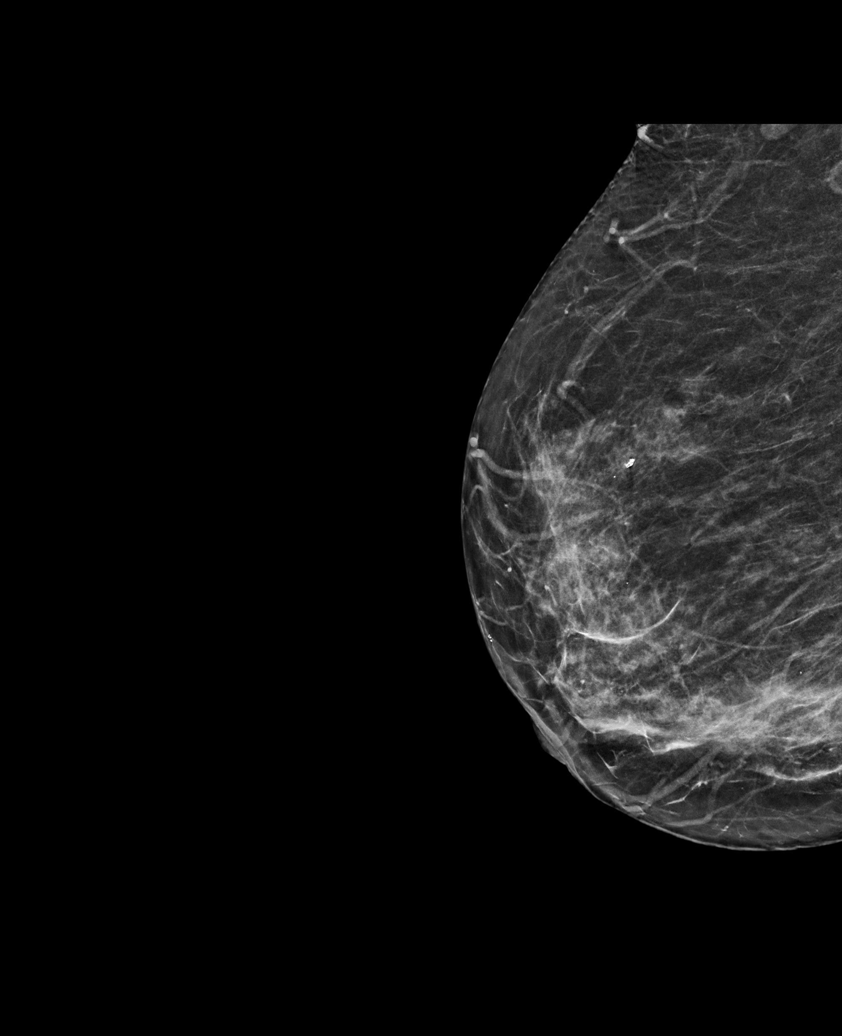

[L CC synth-2D (1 of 2)]
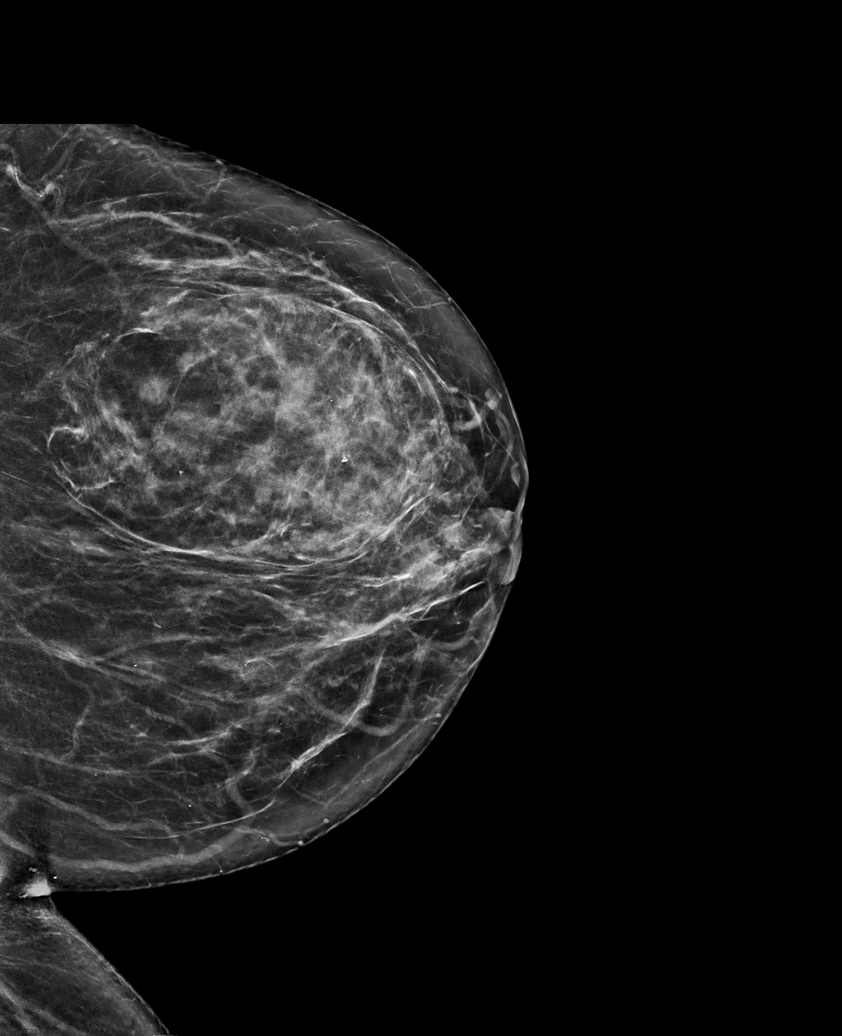

[L MLO synth-2D]
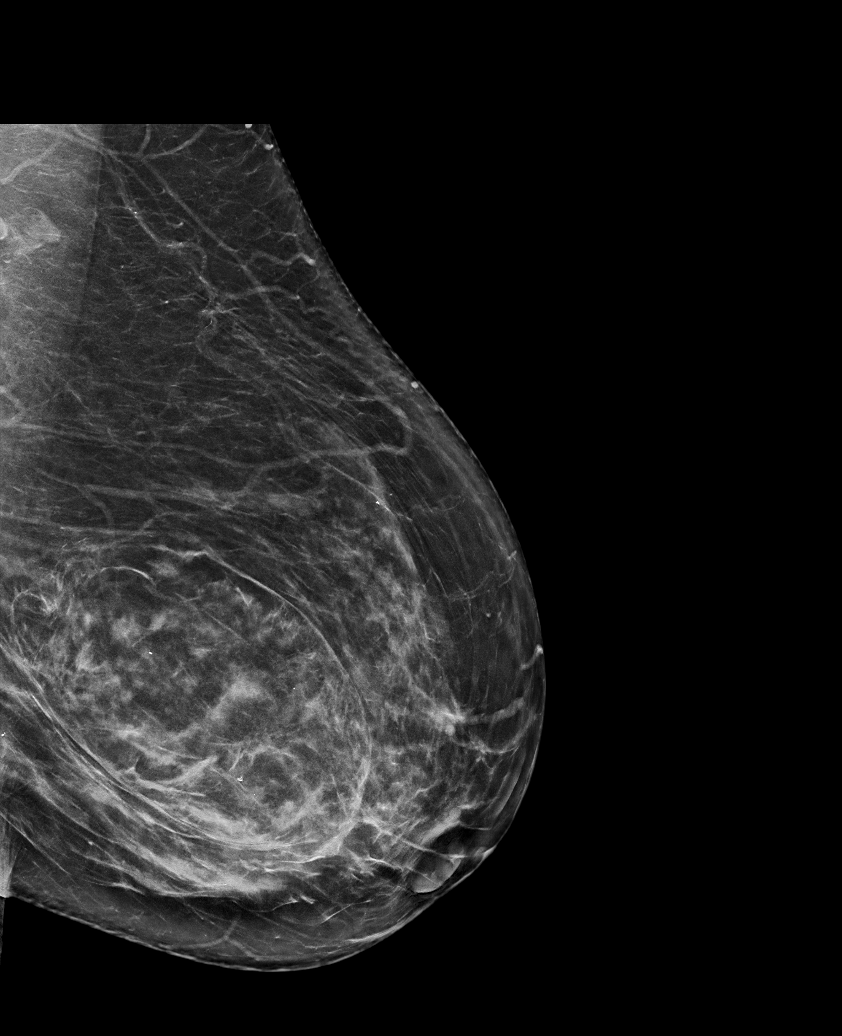

[R CC synth-2D]
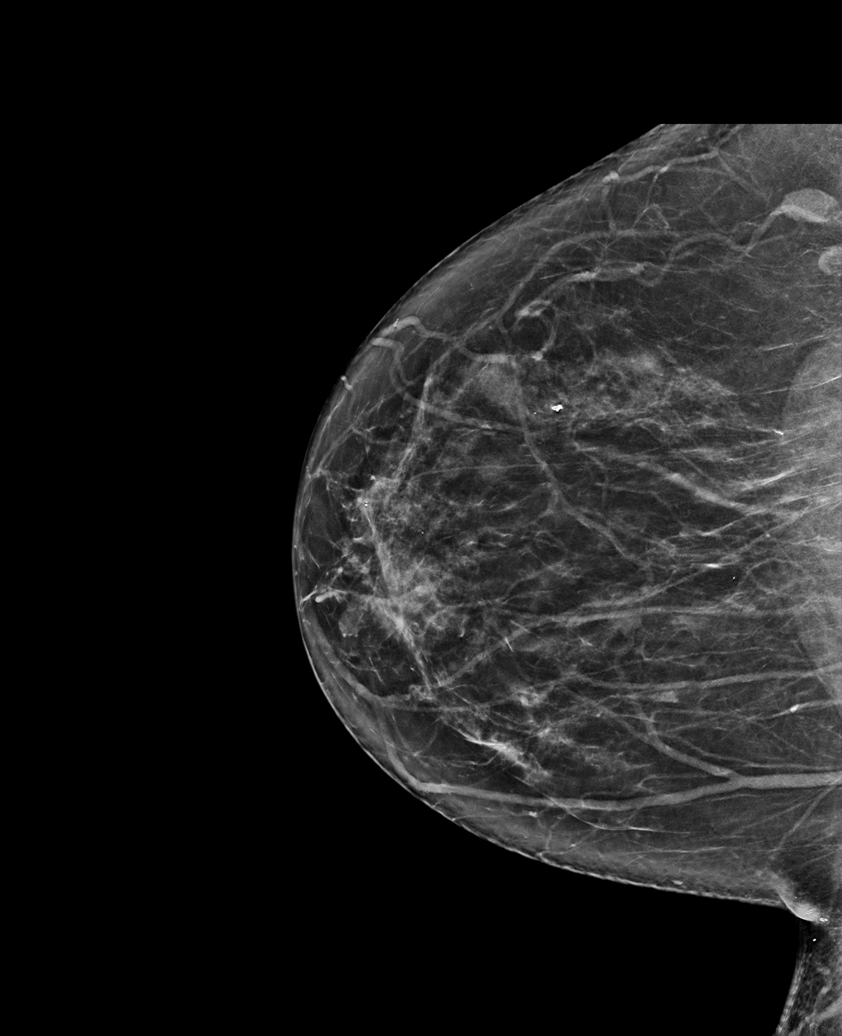

[L CC synth-2D (2 of 2)]
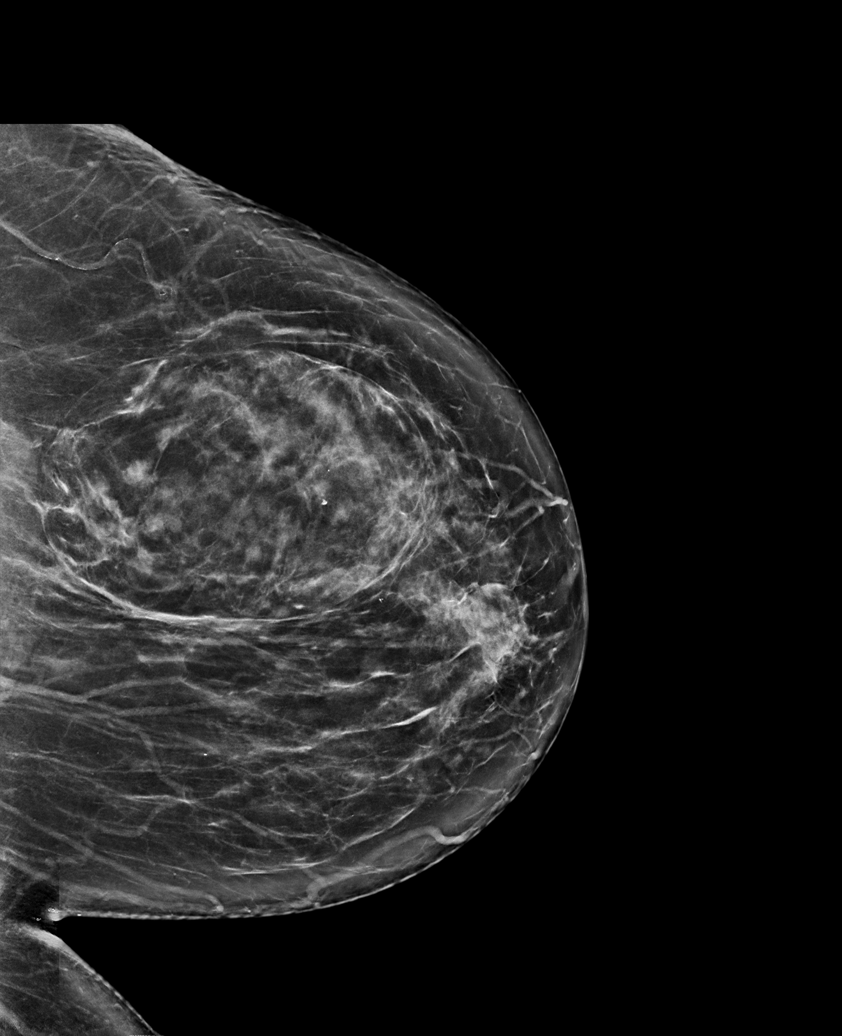

[6 of 36 positions shown; findings below may reference images not displayed]

ACR Breast Density Category c: The breast tissue is heterogeneously
dense, which may obscure small masses.
FINDINGS: There are no findings suspicious for malignancy.
IMPRESSION: No mammographic evidence of malignancy. A result letter of this
screening mammogram will be mailed directly to the patient.

RECOMMENDATION:
Screening mammogram in one year. (Code:Q3-W-BC3)

BI-RADS CATEGORY  1: Negative.

## 2024-04-29 ENCOUNTER — Ambulatory Visit: Admitting: Dermatology
